# Patient Record
Sex: Male | Born: 1990 | Race: White | Hispanic: No | Marital: Married | Smoking: Former smoker
Health system: Southern US, Community
[De-identification: ages and names within clinical notes are randomized; demographics above are authoritative.]

## PROBLEM LIST (undated history)

## (undated) DIAGNOSIS — J45909 Unspecified asthma, uncomplicated: Secondary | ICD-10-CM

## (undated) DIAGNOSIS — F1990 Other psychoactive substance use, unspecified, uncomplicated: Secondary | ICD-10-CM

## (undated) DIAGNOSIS — I10 Essential (primary) hypertension: Secondary | ICD-10-CM

## (undated) DIAGNOSIS — F32A Depression, unspecified: Secondary | ICD-10-CM

## (undated) DIAGNOSIS — F419 Anxiety disorder, unspecified: Secondary | ICD-10-CM

## (undated) HISTORY — DX: Unspecified asthma, uncomplicated: J45.909

## (undated) HISTORY — DX: Other psychoactive substance use, unspecified, uncomplicated: F19.90

## (undated) HISTORY — DX: Depression, unspecified: F32.A

## (undated) HISTORY — DX: Essential (primary) hypertension: I10

## (undated) HISTORY — DX: Anxiety disorder, unspecified: F41.9

## (undated) HISTORY — PX: FRACTURE SURGERY: SHX138

---

## 2020-05-06 DIAGNOSIS — U071 COVID-19: Secondary | ICD-10-CM | POA: Insufficient documentation

## 2020-05-06 DIAGNOSIS — F1320 Sedative, hypnotic or anxiolytic dependence, uncomplicated: Secondary | ICD-10-CM | POA: Insufficient documentation

## 2020-06-04 ENCOUNTER — Encounter: Payer: Self-pay | Admitting: Family Medicine

## 2020-06-04 ENCOUNTER — Ambulatory Visit (INDEPENDENT_AMBULATORY_CARE_PROVIDER_SITE_OTHER): Payer: Self-pay | Admitting: Family Medicine

## 2020-06-04 VITALS — BP 142/86 | HR 85 | Temp 97.9°F | Ht 70.47 in | Wt 269.5 lb

## 2020-06-04 DIAGNOSIS — I10 Essential (primary) hypertension: Secondary | ICD-10-CM

## 2020-06-04 DIAGNOSIS — F321 Major depressive disorder, single episode, moderate: Secondary | ICD-10-CM

## 2020-06-04 DIAGNOSIS — F411 Generalized anxiety disorder: Secondary | ICD-10-CM

## 2020-06-04 DIAGNOSIS — F410 Panic disorder [episodic paroxysmal anxiety] without agoraphobia: Secondary | ICD-10-CM | POA: Insufficient documentation

## 2020-06-04 MED ORDER — LISINOPRIL 20 MG PO TABS
20.0000 mg | ORAL_TABLET | Freq: Every day | ORAL | 1 refills | Status: DC
Start: 2020-06-04 — End: 2021-01-06

## 2020-06-04 MED ORDER — QUETIAPINE FUMARATE 50 MG PO TABS: 25.0000 mg | ORAL_TABLET | Freq: Every day | ORAL | 3 refills | Status: DC

## 2020-06-04 MED ORDER — FLUOXETINE HCL 20 MG PO TABS: 20.0000 mg | ORAL_TABLET | Freq: Every day | ORAL | 3 refills | Status: DC

## 2020-06-04 MED ORDER — BUSPIRONE HCL 10 MG PO TABS: 10.0000 mg | ORAL_TABLET | Freq: Three times a day (TID) | ORAL | 3 refills | Status: DC

## 2020-06-04 NOTE — Assessment & Plan Note (Signed)
He has significant anxiety along with depression.  Discussed management options.  Discussed that I would not recommend restarting benzodiazepines at this time.  Will start fluoxetine 20mg  with buspar 10mg  TID and seroquel 25-50mg  qhs.  Referrals entered for psychiatry for continued medication management  and therapist.

## 2020-06-04 NOTE — Progress Notes (Signed)
Scott Holland - 29 y.o. male MRN [id number]  Date of birth: 1991-02-20  Subjective Chief Complaint  Patient presents with  . Establish Care    HPI Scott Holland is a 29 y.o. male here today for initial visit.  He has history of anxiety and depression which he would like to discuss today.  He also has history of HTN.   -Depression and anxiety: He states that he has had depression and anxiety for several years.  He reports trying multiple medications for treatment including lexapro, sertraline, buspar and benzodiazepines.  He reports that the only thing that has really worked for him was clonazepam and alprazolam.  He reports that lexapro and sertraline were ineffective although he admits he didn't try them for very long.  Buspar was somewhat effective.  He has had associated insomnia and seroquel has worked well for this previously.  He is interested in seeing a therapist and establishing with psychiatry.   -HTN:  Current management with  Lisinopril.  He did recently increased this from 10mg  to 20mg  due to elevated BP readings at home.  He denies chest pain, shortness of breath, headache or vision changes.     HE also had COVID last month.  Reports some continued shortness of breath with exertion.  This has improved with using flovent daily and albuterol as needed.    Depression screen PHQ 2/9 06/04/2020  Decreased Interest 2  Down, Depressed, Hopeless 2  PHQ - 2 Score 4  Altered sleeping 2  Tired, decreased energy 3  Change in appetite 1  Feeling bad or failure about yourself  2  Trouble concentrating 2  Moving slowly or fidgety/restless 2  Suicidal thoughts 0  PHQ-9 Score 16  Difficult doing work/chores Very difficult   GAD 7 : Generalized Anxiety Score 06/04/2020  Nervous, Anxious, on Edge 3  Control/stop worrying 3  Worry too much - different things 3  Trouble relaxing 3  Restless 3  Easily annoyed or irritable 3  Afraid - awful might happen 3  Total GAD 7 Score 21   Anxiety Difficulty Extremely difficult      ROS:  A comprehensive ROS was completed and negative except as noted per HPI  No Known Allergies  Past Medical History:  Diagnosis Date  . Anxiety   . Asthma   . Depression   . Drug use   . Hypertension     Past Surgical History:  Procedure Laterality Date  . FRACTURE SURGERY      Social History   Socioeconomic History  . Marital status: Single    Spouse name: Not on file  . Number of children: Not on file  . Years of education: Not on file  . Highest education level: Not on file  Occupational History  . Occupation: Server  Tobacco Use  . Smoking status: Former Smoker    Packs/day: 1.00    Years: 3.00    Pack years: 3.00    Types: Cigarettes    Quit date: 08/22/2017    Years since quitting: 2.7  . Smokeless tobacco: Never Used  Vaping Use  . Vaping Use: Every day  Substance and Sexual Activity  . Alcohol use: Yes    Alcohol/week: 1.0 - 2.0 standard drink    Types: 1 - 2 Standard drinks or equivalent per week  . Drug use: Yes    Frequency: 2.0 times per week    Types: Marijuana  . Sexual activity: Yes    Partners: Female  Birth control/protection: None  Other Topics Concern  . Not on file  Social History Narrative  . Not on file   Social Determinants of Health   Financial Resource Strain:   . Difficulty of Paying Living Expenses: Not on file  Food Insecurity:   . Worried About Programme researcher, broadcasting/film/video in the Last Year: Not on file  . Ran Out of Food in the Last Year: Not on file  Transportation Needs:   . Lack of Transportation (Medical): Not on file  . Lack of Transportation (Non-Medical): Not on file  Physical Activity:   . Days of Exercise per Week: Not on file  . Minutes of Exercise per Session: Not on file  Stress:   . Feeling of Stress : Not on file  Social Connections:   . Frequency of Communication with Friends and Family: Not on file  . Frequency of Social Gatherings with Friends and Family:  Not on file  . Attends Religious Services: Not on file  . Active Member of Clubs or Organizations: Not on file  . Attends Banker Meetings: Not on file  . Marital Status: Not on file    Family History  Problem Relation Age of Onset  . Hypertension Mother   . Anxiety disorder Mother   . Hypertension Maternal Grandmother   . Anxiety disorder Maternal Grandmother   . Hypertension Maternal Uncle   . Anxiety disorder Maternal Uncle     Health Maintenance  Topic Date Due  . Hepatitis C Screening  Never done  . HIV Screening  Never done  . TETANUS/TDAP  Never done  . COVID-19 Vaccine (1) 06/20/2020 (Originally 04/04/2003)  . INFLUENZA VACCINE  11/19/2020 (Originally 03/22/2020)     ----------------------------------------------------------------------------------------------------------------------------------------------------------------------------------------------------------------- Physical Exam BP (!) 142/86 (BP Location: Left Arm, Patient Position: Sitting, Cuff Size: Large)   Pulse 85   Temp 97.9 F (36.6 C) (Oral)   Ht 5' 10.47" (1.79 m)   Wt 269 lb 8 oz (122.2 kg)   BMI 38.15 kg/m   Physical Exam Constitutional:      Appearance: Normal appearance.  HENT:     Head: Normocephalic and atraumatic.  Eyes:     General: No scleral icterus. Cardiovascular:     Rate and Rhythm: Normal rate and regular rhythm.  Pulmonary:     Effort: Pulmonary effort is normal.     Breath sounds: Normal breath sounds.  Musculoskeletal:     Cervical back: Neck supple.  Skin:    General: Skin is warm and dry.  Neurological:     General: No focal deficit present.     Mental Status: He is alert.  Psychiatric:        Mood and Affect: Mood normal.        Behavior: Behavior normal.      ------------------------------------------------------------------------------------------------------------------------------------------------------------------------------------------------------------------- Assessment and Plan  GAD (generalized anxiety disorder) He has significant anxiety along with depression.  Discussed management options.  Discussed that I would not recommend restarting benzodiazepines at this time.  Will start fluoxetine 20mg  with buspar 10mg  TID and seroquel 25-50mg  qhs.  Referrals entered for psychiatry for continued medication management  and therapist.   Essential hypertension He will continue lisinopril at 20mg .  Updated rx sent in.     Meds ordered this encounter  Medications  . FLUoxetine (PROZAC) 20 MG tablet    Sig: Take 1 tablet (20 mg total) by mouth daily.    Dispense:  30 tablet    Refill:  3  . QUEtiapine (  SEROQUEL) 50 MG tablet    Sig: Take 0.5-1 tablets (25-50 mg total) by mouth at bedtime.    Dispense:  30 tablet    Refill:  3  . busPIRone (BUSPAR) 10 MG tablet    Sig: Take 1 tablet (10 mg total) by mouth 3 (three) times daily.    Dispense:  90 tablet    Refill:  3  . lisinopril (ZESTRIL) 20 MG tablet    Sig: Take 1 tablet (20 mg total) by mouth daily.    Dispense:  90 tablet    Refill:  1    Return in about 3 weeks (around 06/25/2020) for anxiety.    This visit occurred during the SARS-CoV-2 public health emergency.  Safety protocols were in place, including screening questions prior to the visit, additional usage of staff PPE, and extensive cleaning of exam room while observing appropriate contact time as indicated for disinfecting solutions.

## 2020-06-04 NOTE — Assessment & Plan Note (Signed)
He will continue lisinopril at 20mg .  Updated rx sent in.

## 2020-06-04 NOTE — Patient Instructions (Addendum)
Nice to meet you today! Let's start fluoxetine 20mg  daily, Seroquel 25-50mg  at bedtime, Buspar 10mg  three times per day.   I have entered referrals to psychiatry and therapist.  You should be contacted to arrange an appointment.  Keep lisinopril at 20mg  daily.  We'll plan to follow up in 2-3 weeks.

## 2020-06-22 ENCOUNTER — Ambulatory Visit: Payer: Self-pay | Admitting: Family Medicine

## 2020-06-23 ENCOUNTER — Encounter: Payer: Self-pay | Admitting: Family Medicine

## 2020-06-23 ENCOUNTER — Ambulatory Visit (INDEPENDENT_AMBULATORY_CARE_PROVIDER_SITE_OTHER): Payer: Self-pay | Admitting: Family Medicine

## 2020-06-23 DIAGNOSIS — K047 Periapical abscess without sinus: Secondary | ICD-10-CM

## 2020-06-23 DIAGNOSIS — F321 Major depressive disorder, single episode, moderate: Secondary | ICD-10-CM

## 2020-06-23 DIAGNOSIS — I10 Essential (primary) hypertension: Secondary | ICD-10-CM

## 2020-06-23 DIAGNOSIS — F411 Generalized anxiety disorder: Secondary | ICD-10-CM

## 2020-06-23 MED ORDER — FLUOXETINE HCL 20 MG PO TABS
40.0000 mg | ORAL_TABLET | Freq: Every day | ORAL | 3 refills | Status: DC
Start: 2020-06-23 — End: 2020-07-14

## 2020-06-23 MED ORDER — BUSPIRONE HCL 15 MG PO TABS
15.0000 mg | ORAL_TABLET | Freq: Three times a day (TID) | ORAL | 0 refills | Status: DC
Start: 2020-06-23 — End: 2020-07-10

## 2020-06-23 MED ORDER — QUETIAPINE FUMARATE 100 MG PO TABS
100.0000 mg | ORAL_TABLET | Freq: Every day | ORAL | 0 refills | Status: DC
Start: 2020-06-23 — End: 2020-07-10

## 2020-06-23 MED ORDER — BUSPIRONE HCL 15 MG PO TABS
15.0000 mg | ORAL_TABLET | Freq: Three times a day (TID) | ORAL | 0 refills | Status: DC
Start: 2020-06-23 — End: 2020-06-23

## 2020-06-23 MED ORDER — AMOXICILLIN 500 MG PO CAPS: 500.0000 mg | ORAL_CAPSULE | Freq: Three times a day (TID) | ORAL | 0 refills | Status: DC

## 2020-06-23 MED ORDER — QUETIAPINE FUMARATE 100 MG PO TABS
100.0000 mg | ORAL_TABLET | Freq: Every day | ORAL | 0 refills | Status: DC
Start: 2020-06-23 — End: 2020-06-23

## 2020-06-23 MED ORDER — FLUOXETINE HCL 20 MG PO TABS
40.0000 mg | ORAL_TABLET | Freq: Every day | ORAL | 3 refills | Status: DC
Start: 2020-06-23 — End: 2020-06-23

## 2020-06-23 NOTE — Assessment & Plan Note (Signed)
He continues to have anxiety will increase buspar to 15mg  tid and seroquel to 100mg  qhs to help with associated insomnia.

## 2020-06-23 NOTE — Patient Instructions (Addendum)
Please contact: [facility]: [phone] to set up psychiatry appt.  Increased fluoxetine to 40mg  daily, Buspar to 15mg  TID, and seroquel to 100mg  at bedtime.  Start amoxicillin for dental infection.  Follow up in 6-8 weeks.

## 2020-06-23 NOTE — Assessment & Plan Note (Signed)
Blood pressure is at goal at for age and co-morbidities.  I recommend continuation of lisinopril.  In addition they were instructed to follow a low sodium diet with regular exercise to help to maintain adequate control of blood pressure.   

## 2020-06-23 NOTE — Assessment & Plan Note (Signed)
Depression improved but not controlled.  Increase fluoxetine to 40mg  daily.  Given information to schedule appt with [facility] for psychiatry appointment.

## 2020-06-23 NOTE — Progress Notes (Signed)
Scott Holland - 29 y.o. male MRN [id number]  Date of birth: August 12, 1991  Subjective Chief Complaint  Patient presents with  . Anxiety    HPI Scott Holland is a 29 y.o. male here today for follow up of anxiety and insomnia.  He has history of benzodiazepine dependence.  We started fluoxetine as well as buspar and seroquel at his last visit.  He has noticed some improvement but feels like he needs to increase all medications.  He has been referred to psychiatry however they have not been able to get in touch with him.    Depression screen [facility] 2/9 06/23/2020 06/04/2020  Decreased Interest 1 2  Down, Depressed, Hopeless 1 2  PHQ - 2 Score 2 4  Altered sleeping 2 2  Tired, decreased energy 1 3  Change in appetite 1 1  Feeling bad or failure about yourself  1 2  Trouble concentrating 0 2  Moving slowly or fidgety/restless 1 2  Suicidal thoughts 0 0  PHQ-9 Score 8 16  Difficult doing work/chores Somewhat difficult Very difficult   GAD 7 : Generalized Anxiety Score 06/23/2020 06/04/2020  Nervous, Anxious, on Edge 1 3  Control/stop worrying 1 3  Worry too much - different things 2 3  Trouble relaxing 1 3  Restless 1 3  Easily annoyed or irritable 1 3  Afraid - awful might happen 1 3  Total GAD 7 Score 8 21  Anxiety Difficulty Somewhat difficult Extremely difficult   He also has complaint of dental infection.  He has poor dentition with several broken teeth.  He has had some pulled but still needs extensive dental work.  Has had cheek swelling but not currently.  No fever or chills.    ROS:  A comprehensive ROS was completed and negative except as noted per HPI  No Known Allergies  Past Medical History:  Diagnosis Date  . Anxiety   . Asthma   . Depression   . Drug use   . Hypertension     Past Surgical History:  Procedure Laterality Date  . FRACTURE SURGERY      Social History   Socioeconomic History  . Marital status: Single    Spouse name: Not on file  . Number of  children: Not on file  . Years of education: Not on file  . Highest education level: Not on file  Occupational History  . Occupation: Server  Tobacco Use  . Smoking status: Former Smoker    Packs/day: 1.00    Years: 3.00    Pack years: 3.00    Types: Cigarettes    Quit date: 08/22/2017    Years since quitting: 2.8  . Smokeless tobacco: Never Used  Vaping Use  . Vaping Use: Every day  Substance and Sexual Activity  . Alcohol use: Yes    Alcohol/week: 1.0 - 2.0 standard drink    Types: 1 - 2 Standard drinks or equivalent per week  . Drug use: Yes    Frequency: 2.0 times per week    Types: Marijuana  . Sexual activity: Yes    Partners: Female    Birth control/protection: None  Other Topics Concern  . Not on file  Social History Narrative  . Not on file   Social Determinants of Health   Financial Resource Strain:   . Difficulty of Paying Living Expenses: Not on file  Food Insecurity:   . Worried About Programme researcher, broadcasting/film/video in the Last Year: Not on file  . Ran  Out of Food in the Last Year: Not on file  Transportation Needs:   . Lack of Transportation (Medical): Not on file  . Lack of Transportation (Non-Medical): Not on file  Physical Activity:   . Days of Exercise per Week: Not on file  . Minutes of Exercise per Session: Not on file  Stress:   . Feeling of Stress : Not on file  Social Connections:   . Frequency of Communication with Friends and Family: Not on file  . Frequency of Social Gatherings with Friends and Family: Not on file  . Attends Religious Services: Not on file  . Active Member of Clubs or Organizations: Not on file  . Attends Banker Meetings: Not on file  . Marital Status: Not on file    Family History  Problem Relation Age of Onset  . Hypertension Mother   . Anxiety disorder Mother   . Hypertension Maternal Grandmother   . Anxiety disorder Maternal Grandmother   . Hypertension Maternal Uncle   . Anxiety disorder Maternal Uncle      Health Maintenance  Topic Date Due  . Hepatitis C Screening  Never done  . COVID-19 Vaccine (1) Never done  . HIV Screening  Never done  . TETANUS/TDAP  Never done  . INFLUENZA VACCINE  11/19/2020 (Originally 03/22/2020)     ----------------------------------------------------------------------------------------------------------------------------------------------------------------------------------------------------------------- Physical Exam BP 131/72 (BP Location: Left Arm, Patient Position: Sitting, Cuff Size: Large)   Pulse 85   Temp 97.7 F (36.5 C)   Wt 274 lb 9.6 oz (124.6 kg)   SpO2 98%   BMI 38.87 kg/m   Physical Exam Constitutional:      Appearance: Normal appearance.  HENT:     Mouth/Throat:     Comments: Poor dentition with multiple caries and broken teeth.  Gums swollen.  Musculoskeletal:     Cervical back: Neck supple.  Lymphadenopathy:     Cervical: No cervical adenopathy.  Neurological:     Mental Status: He is alert.     ------------------------------------------------------------------------------------------------------------------------------------------------------------------------------------------------------------------- Assessment and Plan  GAD (generalized anxiety disorder) He continues to have anxiety will increase buspar to 15mg  tid and seroquel to 100mg  qhs to help with associated insomnia.    Depression, major, single episode, moderate (HCC) Depression improved but not controlled.  Increase fluoxetine to 40mg  daily.  Given information to schedule appt with [facility] for psychiatry appointment.   Dental infection Will treat with amoxicillin 500mg  tid.  Encouraged to follow up with dentist.   Essential hypertension Blood pressure is at goal at for age and co-morbidities.  I recommend continuation of lisinopril.  In addition they were instructed to follow a low sodium diet with regular exercise to help to maintain adequate control  of blood pressure.     Meds ordered this encounter  Medications  . DISCONTD: busPIRone (BUSPAR) 15 MG tablet    Sig: Take 1 tablet (15 mg total) by mouth 3 (three) times daily.    Dispense:  90 tablet    Refill:  0  . DISCONTD: FLUoxetine (PROZAC) 20 MG tablet    Sig: Take 2 tablets (40 mg total) by mouth daily.    Dispense:  60 tablet    Refill:  3  . DISCONTD: QUEtiapine (SEROQUEL) 100 MG tablet    Sig: Take 1 tablet (100 mg total) by mouth at bedtime.    Dispense:  90 tablet    Refill:  0  . amoxicillin (AMOXIL) 500 MG capsule    Sig: Take 1 capsule (  500 mg total) by mouth 3 (three) times daily.    Dispense:  30 capsule    Refill:  0  . busPIRone (BUSPAR) 15 MG tablet    Sig: Take 1 tablet (15 mg total) by mouth 3 (three) times daily.    Dispense:  90 tablet    Refill:  0  . FLUoxetine (PROZAC) 20 MG tablet    Sig: Take 2 tablets (40 mg total) by mouth daily.    Dispense:  60 tablet    Refill:  3  . QUEtiapine (SEROQUEL) 100 MG tablet    Sig: Take 1 tablet (100 mg total) by mouth at bedtime.    Dispense:  90 tablet    Refill:  0    Return in about 8 weeks (around 08/18/2020) for anxiety.    This visit occurred during the SARS-CoV-2 public health emergency.  Safety protocols were in place, including screening questions prior to the visit, additional usage of staff PPE, and extensive cleaning of exam room while observing appropriate contact time as indicated for disinfecting solutions.

## 2020-06-23 NOTE — Assessment & Plan Note (Signed)
Will treat with amoxicillin 500mg  tid.  Encouraged to follow up with dentist.

## 2020-07-10 ENCOUNTER — Encounter: Payer: Self-pay | Admitting: Family Medicine

## 2020-07-10 ENCOUNTER — Other Ambulatory Visit: Payer: Self-pay | Admitting: Family Medicine

## 2020-07-14 ENCOUNTER — Other Ambulatory Visit: Payer: Self-pay | Admitting: Family Medicine

## 2020-07-14 MED ORDER — QUETIAPINE FUMARATE 100 MG PO TABS
100.0000 mg | ORAL_TABLET | Freq: Every day | ORAL | 0 refills | Status: DC
Start: 2020-07-14 — End: 2020-10-16

## 2020-07-14 MED ORDER — BUSPIRONE HCL 15 MG PO TABS
15.0000 mg | ORAL_TABLET | Freq: Three times a day (TID) | ORAL | 0 refills | Status: DC
Start: 2020-07-14 — End: 2020-08-19

## 2020-07-14 MED ORDER — ESCITALOPRAM OXALATE 10 MG PO TABS: ORAL_TABLET | ORAL | 1 refills | Status: DC

## 2020-07-22 ENCOUNTER — Ambulatory Visit: Payer: Self-pay | Admitting: Allergy

## 2020-07-28 ENCOUNTER — Encounter: Payer: Self-pay | Admitting: Nurse Practitioner

## 2020-07-28 ENCOUNTER — Ambulatory Visit (INDEPENDENT_AMBULATORY_CARE_PROVIDER_SITE_OTHER): Payer: Self-pay

## 2020-07-28 ENCOUNTER — Other Ambulatory Visit: Payer: Self-pay

## 2020-07-28 ENCOUNTER — Telehealth (INDEPENDENT_AMBULATORY_CARE_PROVIDER_SITE_OTHER): Payer: Self-pay | Admitting: Nurse Practitioner

## 2020-07-28 DIAGNOSIS — R0602 Shortness of breath: Secondary | ICD-10-CM

## 2020-07-28 DIAGNOSIS — R0609 Other forms of dyspnea: Secondary | ICD-10-CM

## 2020-07-28 DIAGNOSIS — R0600 Dyspnea, unspecified: Secondary | ICD-10-CM

## 2020-07-28 DIAGNOSIS — R059 Cough, unspecified: Secondary | ICD-10-CM

## 2020-07-28 DIAGNOSIS — J0110 Acute frontal sinusitis, unspecified: Secondary | ICD-10-CM

## 2020-07-28 DIAGNOSIS — R5081 Fever presenting with conditions classified elsewhere: Secondary | ICD-10-CM

## 2020-07-28 DIAGNOSIS — U099 Post covid-19 condition, unspecified: Secondary | ICD-10-CM

## 2020-07-28 MED ORDER — AMOXICILLIN-POT CLAVULANATE 875-125 MG PO TABS: 1.0000 | ORAL_TABLET | Freq: Two times a day (BID) | ORAL | 0 refills | Status: DC

## 2020-07-28 MED ORDER — PREDNISONE 50 MG PO TABS: 50.0000 mg | ORAL_TABLET | Freq: Every day | ORAL | 0 refills | Status: AC

## 2020-07-28 MED ORDER — MONTELUKAST SODIUM 10 MG PO TABS: 10.0000 mg | ORAL_TABLET | Freq: Every day | ORAL | 3 refills | Status: DC

## 2020-07-28 MED ORDER — FLUTICASONE-SALMETEROL 250-50 MCG/DOSE IN AEPB: 1.0000 | INHALATION_SPRAY | Freq: Two times a day (BID) | RESPIRATORY_TRACT | 3 refills | Status: DC

## 2020-07-28 NOTE — Progress Notes (Addendum)
Virtual Video Visit via MyChart Note  I connected with  Eithen Ogan on 07/28/20 at 10:50 AM EST by the video enabled telemedicine application for , MyChart, and verified that I am speaking with the correct person using two identifiers.   I introduced myself as a Publishing rights manager with the practice. We discussed the limitations of evaluation and management by telemedicine and the availability of in person appointments. The patient expressed understanding and agreed to proceed.  Participating parties in this visit include: The patient and the nurse practitioner listed only The patient is: at home I am: In the office  Subjective:    CC:  Chief Complaint  Patient presents with  . URI    onset 2 weeks ago. wheezing, ShOB, chills, sweats, BA, fatigue, feverish, congestion, has been taking Mucinex, Dayquil, completed atbx 5 days ago, completed prednisone 2 days ago from girlfriends old Rx    HPI: Tashi Band is a 29 y.o. y/o male presenting via MyChart today for flu-like symptoms that started about 2 weeks ago. He reports that his daughter was sick initially and then he became ill. His daughter has been diagnosed with the flu as has his girlfriend.   He endorses shortness of breath, cough, wheezing, chills, fever, congestion, body aches, sinus pain and pressure, headaches, bilateral ear pain, nausea, vomiting, diarrhea, and fatigue.  He states that he cannot talk without going into coughing fits. He is not sleeping well. He also reports that he has been dealing with severe anxiety disorder and feels this exacerbates his symptoms.   He tells me that he had COVID in September and has not returned to his baseline with breathing.   He denies loss of taste and loss of smell.   He has been taking Mucinex, Dayquil, Theraflu, Flonase with minimal relief. He uses Flovent 110 and albuterol regularly and feels that they are only helping with the symptoms for the first few minutes after use.   He  completed a round of amoxicillin approximately 5 days ago and reports that he also took a prescription for 5 day prednisone taper that his girlfriend had from previous illness and completed this 2 days ago.  Past medical history, Surgical history, Family history not pertinant except as noted below, Social history, Allergies, and medications have been entered into the medical record, reviewed, and corrections made.   Review of Systems:  All review of systems negative except what is listed in the HPI   Objective:    General: Speaking clearly in complete sentences. Audible congestion and cough present. Mild shortness of breath when speaking noted.    Alert and oriented x3.   Normal judgment.  No apparent acute distress.   Impression and Recommendations:    1. Acute non-recurrent frontal sinusitis 2. Cough 3. Dyspnea on exertion 4. Fever in other diseases Symptoms and presentation consistent with acute sinusitis of suspected bacterial origin given the length of time symptoms have been present. Also strongly suspect the presence of flu A today given that both his girlfriend and daughter have been diagnosed with the same.  Will plan for chest x-ray today given ongoing shortness of breath and respiratory symptoms since COVID in September to rule out possible pneumonia.  Change fluticasone inhaler to fluticasone-salmeterol and increase dose for BID use.  Prednisone burst for 5 days given for additional symptoms of shortness of breath and wheezing.  Patient may benefit from pulmonology referral based on continued respiratory symptoms after COVID-19. Will make final determination after CXR evaluation.  If pna is present, patient may need additional antibiotic coverage.  Discussed OTC medications that may be helpful for symptom management. DIscussed emergency symptoms that would warrant immediate evaluation in the ED.  Follow-up if symptoms worsen or fail to improve.  Work note provided VIA  mychart to remain out of work through Saturday 12/11 - DG Chest 2 View - montelukast (SINGULAIR) 10 MG tablet; Take 1 tablet (10 mg total) by mouth at bedtime.  Dispense: 30 tablet; Refill: 3 - predniSONE (DELTASONE) 50 MG tablet; Take 1 tablet (50 mg total) by mouth daily with breakfast for 5 days.  Dispense: 5 tablet; Refill: 0 - amoxicillin-clavulanate (AUGMENTIN) 875-125 MG tablet; Take 1 tablet by mouth 2 (two) times daily.  Dispense: 14 tablet; Refill: 0 - Fluticasone-Salmeterol (ADVAIR DISKUS) 250-50 MCG/DOSE AEPB; Inhale 1 puff into the lungs 2 (two) times daily.  Dispense: 60 each; Refill: 3  Addendum: 07/29/2020 1542 Chest x-ray results show no signs of cardiopulmonary abnormality.   Singulair patient cost over $100. Due to expense will change back to fluticasone and add LABA separately to see if this is more affordable option.     I discussed the assessment and treatment plan with the patient. The patient was provided an opportunity to ask questions and all were answered. The patient agreed with the plan and demonstrated an understanding of the instructions.   The patient was advised to call back or seek an in-person evaluation if the symptoms worsen or if the condition fails to improve as anticipated.  I provided 21 minutes of non-face-to-face interaction with this MYCHART visit including intake, same-day documentation, and chart review.   Tollie Eth, NP

## 2020-07-28 NOTE — Patient Instructions (Signed)
*   Continue to use your albuterol inhaler as prescribed for fast relief of symptoms.  * I have sent an inhaler called Advair (fluticasone-salmeterol) to the pharmacy for you. Stop using the flovent (fluticasone) inhaler you have and start this one- it has a stronger dose of the same medication plus another long acting medication. Be sure to rinse your mouth after using.  * You can continue to use Mucinex, Flonase and other over the counter flu medications. Be sure you are not double dosing on the same medication by reading the packaging carefully.  * Allow yourself to rest as much as possible. I have provided a work note through Saturday.  * I have sent a 5 day burst of Prednisone 50mg . You will take one of these every morning for 5 days.  * I have sent 7 days of Augmentin. You will take this twice a day (take with food) for 7 days. Please finish even if your symptoms improve to prevent antibiotic resistance.  * If your chest x-ray shows pneumonia, I will send in another antibiotic for you to be used with the Augmentin.  * Humidified air can help some of the symptoms you are experiencing. You can use a humidifier or boil water on the stove and stand near the steam (be careful not to get burned by the steam).  The cough and shortness of breath may take several more weeks to fully improve. Unfortunately we are seeing this with COVID. We can do a pulmonology referral and consider pulmonary rehab if this continues despite improvement of other symptoms.   Please let know if you symptoms worsen or fail to improve.

## 2020-07-29 ENCOUNTER — Telehealth: Payer: Self-pay

## 2020-07-29 MED ORDER — FLUTICASONE PROPIONATE HFA 110 MCG/ACT IN AERO: 2.0000 | INHALATION_SPRAY | Freq: Two times a day (BID) | RESPIRATORY_TRACT | 2 refills | Status: DC

## 2020-07-29 MED ORDER — SALMETEROL XINAFOATE 50 MCG/DOSE IN AEPB: 1.0000 | INHALATION_SPRAY | Freq: Two times a day (BID) | RESPIRATORY_TRACT | 2 refills | Status: DC

## 2020-07-29 NOTE — Telephone Encounter (Signed)
Pt's girlfriend [place] (on [place] form) called to check on the status of the CXR results. She also said that they went to the pharmacy and that the Advair will cost over  $100.00. She said this is the price with using [facility]. She is wanting to know if there is anything cheaper he could be switched to.

## 2020-07-29 NOTE — Addendum Note (Signed)
Addended by: Tamie Minteer, [place] Dec E on: 07/29/2020 03:47 PM   Modules accepted: Orders

## 2020-07-29 NOTE — Telephone Encounter (Signed)
Chest x-ray result note sent- it didn't result on my end until after 3pm today. Unfortunately, I think the dual therapy inhalers will all be about the same, but I can send for the increased fluticasone only and see if that helps with is symptoms and is more affordable.

## 2020-07-29 NOTE — Progress Notes (Signed)
Chest x-ray results have come in and show no signs of pneumonia or other abnormality, which is great. We do see ongoing cough and respiratory symptoms with patients who have had COVID and these can often last for several weeks to even months.   If your symptoms are still present when you have completed the antibiotic and steroid, we may consider a referral to pulmonology for further evaluation and recommendations on management.   I am hopeful restarting the Singulair will help with symptoms, as well.

## 2020-07-30 NOTE — Telephone Encounter (Signed)
Pt and his girlfriend aware of results via MyChart.

## 2020-08-04 ENCOUNTER — Other Ambulatory Visit: Payer: Self-pay | Admitting: Family Medicine

## 2020-08-04 MED ORDER — BUPROPION HCL ER (XL) 150 MG PO TB24: 150.0000 mg | ORAL_TABLET | Freq: Every day | ORAL | 0 refills | Status: DC

## 2020-08-05 ENCOUNTER — Other Ambulatory Visit: Payer: Self-pay

## 2020-08-05 DIAGNOSIS — R0609 Other forms of dyspnea: Secondary | ICD-10-CM

## 2020-08-05 DIAGNOSIS — R059 Cough, unspecified: Secondary | ICD-10-CM

## 2020-08-05 MED ORDER — SALMETEROL XINAFOATE 50 MCG/DOSE IN AEPB: 1.0000 | INHALATION_SPRAY | Freq: Two times a day (BID) | RESPIRATORY_TRACT | 2 refills | Status: DC

## 2020-08-05 MED ORDER — BUPROPION HCL ER (XL) 150 MG PO TB24
150.0000 mg | ORAL_TABLET | Freq: Every day | ORAL | 0 refills | Status: DC
Start: 2020-08-05 — End: 2020-12-02

## 2020-08-14 ENCOUNTER — Other Ambulatory Visit: Payer: Self-pay

## 2020-08-14 ENCOUNTER — Emergency Department (HOSPITAL_COMMUNITY): Payer: Self-pay

## 2020-08-14 ENCOUNTER — Encounter (HOSPITAL_COMMUNITY): Payer: Self-pay | Admitting: Emergency Medicine

## 2020-08-14 ENCOUNTER — Telehealth: Payer: Medicaid Other | Admitting: Physician Assistant

## 2020-08-14 ENCOUNTER — Emergency Department (HOSPITAL_COMMUNITY)
Admission: EM | Admit: 2020-08-14 | Discharge: 2020-08-14 | Disposition: A | Discharge status: Home / Self Care | Payer: Self-pay | Attending: Emergency Medicine | Admitting: Emergency Medicine

## 2020-08-14 DIAGNOSIS — J45909 Unspecified asthma, uncomplicated: Secondary | ICD-10-CM | POA: Insufficient documentation

## 2020-08-14 DIAGNOSIS — Z8616 Personal history of COVID-19: Secondary | ICD-10-CM | POA: Insufficient documentation

## 2020-08-14 DIAGNOSIS — K029 Dental caries, unspecified: Secondary | ICD-10-CM | POA: Insufficient documentation

## 2020-08-14 DIAGNOSIS — K0889 Other specified disorders of teeth and supporting structures: Secondary | ICD-10-CM

## 2020-08-14 DIAGNOSIS — Z87891 Personal history of nicotine dependence: Secondary | ICD-10-CM | POA: Insufficient documentation

## 2020-08-14 DIAGNOSIS — K047 Periapical abscess without sinus: Secondary | ICD-10-CM

## 2020-08-14 DIAGNOSIS — I10 Essential (primary) hypertension: Secondary | ICD-10-CM | POA: Insufficient documentation

## 2020-08-14 DIAGNOSIS — Z79899 Other long term (current) drug therapy: Secondary | ICD-10-CM | POA: Insufficient documentation

## 2020-08-14 LAB — CBC WITH DIFFERENTIAL/PLATELET
Abs Immature Granulocytes: 0.02 10*3/uL (ref 0.00–0.07)
Basophils Absolute: 0.1 10*3/uL (ref 0.0–0.1)
Basophils Relative: 1 %
Eosinophils Absolute: 0.4 10*3/uL (ref 0.0–0.5)
Eosinophils Relative: 4 %
HCT: 39.8 % (ref 39.0–52.0)
Hemoglobin: 12.9 g/dL — ABNORMAL LOW (ref 13.0–17.0)
Immature Granulocytes: 0 %
Lymphocytes Relative: 22 %
Lymphs Abs: 1.8 10*3/uL (ref 0.7–4.0)
MCH: 31.2 pg (ref 26.0–34.0)
MCHC: 32.4 g/dL (ref 30.0–36.0)
MCV: 96.4 fL (ref 80.0–100.0)
Monocytes Absolute: 0.8 10*3/uL (ref 0.1–1.0)
Monocytes Relative: 10 %
Neutro Abs: 5.2 10*3/uL (ref 1.7–7.7)
Neutrophils Relative %: 63 %
Platelets: 281 10*3/uL (ref 150–400)
RBC: 4.13 MIL/uL — ABNORMAL LOW (ref 4.22–5.81)
RDW: 12.8 % (ref 11.5–15.5)
WBC: 8.3 10*3/uL (ref 4.0–10.5)
nRBC: 0 % (ref 0.0–0.2)

## 2020-08-14 LAB — COMPREHENSIVE METABOLIC PANEL
ALT: 26 U/L (ref 0–44)
AST: 20 U/L (ref 15–41)
Albumin: 3.6 g/dL (ref 3.5–5.0)
Alkaline Phosphatase: 34 U/L — ABNORMAL LOW (ref 38–126)
Anion gap: 8 (ref 5–15)
BUN: 11 mg/dL (ref 6–20)
CO2: 28 mmol/L (ref 22–32)
Calcium: 9 mg/dL (ref 8.9–10.3)
Chloride: 103 mmol/L (ref 98–111)
Creatinine, Ser: 0.91 mg/dL (ref 0.61–1.24)
GFR, Estimated: >60 mL/min (ref >60)
Glucose, Bld: 93 mg/dL (ref 70–99)
Potassium: 4.7 mmol/L (ref 3.5–5.1)
Sodium: 139 mmol/L (ref 135–145)
Total Bilirubin: 0.5 mg/dL (ref 0.3–1.2)
Total Protein: 6.1 g/dL — ABNORMAL LOW (ref 6.5–8.1)

## 2020-08-14 MED ORDER — HYDROCODONE-ACETAMINOPHEN 5-325 MG PO TABS
1.0000 | ORAL_TABLET | Freq: Once | ORAL | Status: AC
  Administered 2020-08-14: 13:00:00 1 via ORAL
  Filled 2020-08-14: qty 1

## 2020-08-14 MED ORDER — BUSPIRONE HCL 10 MG PO TABS
15.0000 mg | ORAL_TABLET | Freq: Three times a day (TID) | ORAL | Status: DC
  Administered 2020-08-14: 16:00:00 15 mg via ORAL
  Filled 2020-08-14: qty 2

## 2020-08-14 MED ORDER — KETOROLAC TROMETHAMINE 30 MG/ML IJ SOLN
30.0000 mg | Freq: Once | INTRAMUSCULAR | Status: AC
  Administered 2020-08-14: 16:00:00 30 mg via INTRAVENOUS
  Filled 2020-08-14: qty 1

## 2020-08-14 MED ORDER — BUPROPION HCL ER (XL) 150 MG PO TB24
150.0000 mg | ORAL_TABLET | Freq: Every day | ORAL | Status: DC
  Administered 2020-08-14: 16:00:00 150 mg via ORAL
  Filled 2020-08-14: qty 1

## 2020-08-14 MED ORDER — IOHEXOL 350 MG/ML SOLN
80.0000 mL | Freq: Once | INTRAVENOUS | Status: AC | PRN
  Administered 2020-08-14: 80 mL via INTRAVENOUS

## 2020-08-14 NOTE — Discharge Instructions (Addendum)
Your laboratory results were within normal limits today.  The CT scan of your face and mouth did show many dental cavities however no evidence of inflammation/infection or pocket of infection that would need to be drained.  You have a very small amount of fluid in your sinuses that are on either side of your nose under your eyes however this is minimal.    Please try saline nasal sprays, in addition to taking an allergy medicine such as Allegra, Claritin, or Zyrtec to help this fluid drained.  If you develop fevers or have additional concerns please seek additional medical care.  Please take Ibuprofen (Advil, motrin) and Tylenol (acetaminophen) to relieve your pain.  You may take up to 600 MG (3 pills) of normal strength ibuprofen every 8 hours as needed.  In between doses of ibuprofen you make take tylenol, up to 1,000 mg (two extra strength pills).  Do not take more than 3,000 mg tylenol in a 24 hour period.  Please check all medication labels as many medications such as pain and cold medications may contain tylenol.  Do not drink alcohol while taking these medications.  Do not take other NSAID'S while taking ibuprofen (such as aleve or naproxen).  Please take ibuprofen with food to decrease stomach upset.  In the emergency room you are given Toradol through your IV.  Please wait at least 6 hours after this before you take any ibuprofen, Aleve, naproxen or Naprosyn as these are in the same drug class.  Today you received medications that may make you sleepy or impair your ability to make decisions.  For the next 12 hours please do not drive, operate heavy machinery, care for a small child with out another adult present, or perform any activities that may cause harm to you or someone else if you were to fall asleep or be impaired.

## 2020-08-14 NOTE — ED Provider Notes (Signed)
[facility]   CSN: [id number] Arrival date & time: 08/14/20  1220     History Chief Complaint  Patient presents with  . Dental Pain    Billy FischerDylan Pichardo is a 29 y.o. male.  29 y.o male with a PMH of Anxiety, presents to the ED with a chief complaint of dental pain x 3 weeks.  Patient was evaluated via telehealth, advised to be seen in the ED for further evaluation.  The history is provided by the patient.  Dental Pain Location:  Upper Quality:  Aching and constant Severity:  Moderate Onset quality:  Gradual Duration:  3 weeks Timing:  Constant Progression:  Worsening Chronicity:  Recurrent Context: abscess, dental caries and poor dentition   Context: not recent dental surgery and not trauma   Relieved by:  Nothing Worsened by:  Cold food/drink Ineffective treatments:  Acetaminophen and NSAIDs Associated symptoms: facial pain and headaches   Associated symptoms: no difficulty swallowing, no fever, no neck swelling, no oral bleeding and no oral lesions   Risk factors: lack of dental care   Risk factors: no cancer, no diabetes, no immunosuppression and no periodontal disease        Past Medical History:  Diagnosis Date  . Anxiety   . Asthma   . Depression   . Drug use   . Hypertension     Patient Active Problem List   Diagnosis Date Noted  . Dental infection 06/23/2020  . Depression, major, single episode, moderate (HCC) 06/04/2020  . GAD (generalized anxiety disorder) 06/04/2020  . Essential hypertension 06/04/2020  . Severe benzodiazepine use disorder (HCC) 05/06/2020  . COVID-19 virus detected 05/06/2020    Past Surgical History:  Procedure Laterality Date  . FRACTURE SURGERY         Family History  Problem Relation Age of Onset  . Hypertension Mother   . Anxiety disorder Mother   . Hypertension Maternal Grandmother   . Anxiety disorder Maternal Grandmother   . Hypertension Maternal Uncle   .  Anxiety disorder Maternal Uncle     Social History   Tobacco Use  . Smoking status: Former Smoker    Packs/day: 1.00    Years: 3.00    Pack years: 3.00    Types: Cigarettes    Quit date: 08/22/2017    Years since quitting: 2.9  . Smokeless tobacco: Never Used  Vaping Use  . Vaping Use: Every day  Substance Use Topics  . Alcohol use: Yes    Alcohol/week: 1.0 - 2.0 standard drink    Types: 1 - 2 Standard drinks or equivalent per week  . Drug use: Yes    Frequency: 2.0 times per week    Types: Marijuana    Home Medications Prior to Admission medications   Medication Sig Start Date End Date Taking? Authorizing Provider  acetaminophen (TYLENOL) 500 MG tablet Take 1,000 mg by mouth every 8 (eight) hours as needed (pain).   Yes [provider]  albuterol (VENTOLIN HFA) 108 (90 Base) MCG/ACT inhaler Inhale 2 puffs into the lungs every 6 (six) hours as needed for wheezing or shortness of breath.   Yes [provider]  Ascorbic Acid (VITAMIN C PO) Take 1 tablet by mouth daily.   Yes [provider]  buPROPion (WELLBUTRIN XL) 150 MG 24 hr tablet Take 1 tablet (150 mg total) by mouth daily. 08/05/20  Yes Everrett CoombeMatthews, Cody, DO  busPIRone (BUSPAR) 15 MG tablet Take 1 tablet (15 mg  total) by mouth 3 (three) times daily. 07/14/20  Yes Sunnie Nielsen, DO  escitalopram (LEXAPRO) 10 MG tablet Start 5mg  daily x1 week then increase to 10mg  daily Patient taking differently: Take 10 mg by mouth at bedtime. 07/14/20  Yes , DO  fluticasone (FLOVENT HFA) 110 MCG/ACT inhaler Inhale 2 puffs into the lungs in the morning and at bedtime. Use immediately after salmeterol (Serevent) inhaler. Rinse mouth after each use. Patient taking differently: Inhale 2 puffs into the lungs in the morning and at bedtime. Rinse mouth after each use. 07/29/20  Yes Early, Everrett Coombe, NP  ibuprofen (ADVIL) 200 MG tablet Take 800 mg by mouth every 8 (eight) hours as needed (pain).   Yes [provider]  lisinopril (ZESTRIL) 20 MG tablet Take 1 tablet (20 mg total) by mouth daily. 06/04/20  Yes Sung Amabile, DO  montelukast (SINGULAIR) 10 MG tablet Take 1 tablet (10 mg total) by mouth at bedtime. 07/28/20  Yes Early, Everrett Coombe, NP  Multiple Vitamin (MULTIVITAMIN WITH MINERALS) TABS tablet Take 1 tablet by mouth daily.   Yes [provider]  QUEtiapine (SEROQUEL) 100 MG tablet Take 1 tablet (100 mg total) by mouth at bedtime. 07/14/20  Yes Sung Amabile, DO  salmeterol (SEREVENT) 50 MCG/DOSE diskus inhaler Inhale 1 puff into the lungs 2 (two) times daily. Use immediately before fluticasone (Flovent) inhaler. Patient not taking: No sig reported 08/05/20   Sunnie Nielsen, DO    Allergies    Patient has no known allergies.  Review of Systems   Review of Systems  Constitutional: Negative for fever.  HENT: Positive for dental problem. Negative for mouth sores.   Neurological: Positive for headaches.    Physical Exam Updated Vital Signs BP (!) 159/85 (BP Location: Right Arm)   Pulse 94   Temp 97.9 F (36.6 C) (Oral)   Resp 18   SpO2 99%   Physical Exam Vitals and nursing note reviewed.  Constitutional:      Appearance: He is well-developed and well-nourished.  HENT:     Head: Normocephalic and atraumatic.     Nose:     Right Sinus: Maxillary sinus tenderness present.     Left Sinus: Maxillary sinus tenderness present.     Mouth/Throat:     Mouth: Oropharynx is clear and moist. Mucous membranes are moist.     Dentition: Dental tenderness and dental caries present. No gingival swelling or dental abscesses.     Pharynx: Uvula midline. No pharyngeal swelling.     Tonsils: No tonsillar exudate or tonsillar abscesses.      Comments: Poor dentition throughout, no visible dental abscess on my exam.  Eyes:     General: No scleral icterus.    Pupils: Pupils are equal, round, and reactive to light.  Cardiovascular:     Heart sounds: Normal heart sounds.   Pulmonary:     Effort: Pulmonary effort is normal.     Breath sounds: Normal breath sounds. No wheezing.  Chest:     Chest wall: No tenderness.  Abdominal:     General: Bowel sounds are normal. There is no distension.     Palpations: Abdomen is soft.     Tenderness: There is no abdominal tenderness.  Musculoskeletal:        General: No tenderness or deformity.     Cervical back: Normal range of motion.  Skin:    General: Skin is warm and dry.  Neurological:     Mental Status: He is alert  and oriented to person, place, and time.     ED Results / Procedures / Treatments   Labs (all labs ordered are listed, but only abnormal results are displayed) Labs Reviewed  CBC WITH DIFFERENTIAL/PLATELET - Abnormal; Notable for the following components:      Result Value   RBC 4.13 (*)    Hemoglobin 12.9 (*)    All other components within normal limits  COMPREHENSIVE METABOLIC PANEL - Abnormal; Notable for the following components:   Total Protein 6.1 (*)    Alkaline Phosphatase 34 (*)    All other components within normal limits    EKG None  Radiology No results found.  Procedures Procedures (including critical care time)  Medications Ordered in ED Medications  busPIRone (BUSPAR) tablet 15 mg (has no administration in time range)  buPROPion (WELLBUTRIN XL) 24 hr tablet 150 mg (has no administration in time range)  HYDROcodone-acetaminophen (NORCO/VICODIN) 5-325 MG per tablet 1 tablet (1 tablet Oral Given 08/14/20 1309)    ED Course  I have reviewed the triage vital signs and the nursing notes.  Pertinent labs & imaging results that were available during my care of the patient were reviewed by me and considered in my medical decision making (see chart for details).    MDM Rules/Calculators/A&P       Patient with a past medical history of anxiety presents to the ED with chief complaint of dental abscess and pain for the past 3 weeks.  Evaluated via telehealth, advised to  be seen in the ED for further evaluation.  Patient reports pain began on the right side of his mouth, has now moved onto the left, now it involves his sinuses along with head.  He reports after the last 5 days he has had more severe pain along with constant headaches.  He has tried tramadol, completed a course of amoxicillin, and is currently on Bactrim to treat a dental infection.  He does have a dental appointment scheduled for Monday morning.  During evaluation no visible abscess is seen, there is poor dentition throughout, multiple teeth are broken, multiple keratoses are noted.  There is pain along the maxillary sinus.  Uvula is midline, no visible abscess, no tonsillar exudates, no PTA noted.  Phonation is intact.  He does report pain is exacerbated with eating and drinking, soda noted at patient's bedside.  2:42 PM patient is requesting more pain medication, he was given a Vicodin while in the ED. States that his pain is now worse than when he came in. Discussed with patient there were currently awaiting CT imaging along with labs. Patient reports he did not take any of his anxiety medication is requesting these at this time, I advised him the pharmacy technician will update his medication and I will be happy to provide his anxiety home meds.   Interpretation of his labs reveal a CMP without any electrolyte abnormality, kidney function is unremarkable. LFTs are within normal limits. CBC without any signs of leukocytosis, hemoglobin slightly decreased but stable and no priors or comparisons.Patient is awaiting CT maxillofacial.   3:06 PM patient's home meds have been ordered, CT maxillofacial is currently pending.   Patient care signed out to incoming provider pending CT.     Portions of this note were generated with Scientist, clinical (histocompatibility and immunogenetics). Dictation errors may occur despite best attempts at proofreading.  Final Clinical Impression(s) / ED Diagnoses Final diagnoses:  Pain, dental    Rx  / DC Orders ED Discharge Orders  None       Claude Manges, PA-C 08/14/20 1521    Linwood Dibbles, MD 08/15/20 [phone]

## 2020-08-14 NOTE — ED Notes (Signed)
Pt stated that his pain is now worse than when he first came in. RN notified.

## 2020-08-14 NOTE — ED Triage Notes (Signed)
Pt arrives to ED with c/o of right lower dental pain that radiates to the left side of his mouth x4 days. Pt states the pain has not been resolved with OTC meds. Has headache.

## 2020-08-14 NOTE — ED Provider Notes (Signed)
I assumed care of patient at shift change from previous team, briefly patient is a 29 year old male with a past medical history of anxiety who presents with dental pain.  He had reported inability to swallow however was noted to have a drink at bedside.   Physical Exam  BP (!) 159/85 (BP Location: Right Arm)   Pulse 94   Temp 97.9 F (36.6 C) (Oral)   Resp 18   SpO2 99%   Physical Exam Patient initially resting comfortably in bed.  When I walk over to him he starts fidgeting constantly moving his arms and legs.  His voice is not muffled.   ED Course/Procedures     Procedures  DG Chest 2 View  Result Date: 07/29/2020 CLINICAL DATA:  Shortness of breath cough with COVID in September. Cough ever since EXAM: CHEST - 2 VIEW COMPARISON:  May 06, 2020 FINDINGS: Trachea midline. Cardiomediastinal contours and hilar structures are normal. Lungs are clear.  No sign of pleural effusion. On limited assessment no acute skeletal process. Signs of spinal degenerative changes. IMPRESSION: No acute cardiopulmonary disease. Electronically Signed   By: Donzetta Kohut M.D.   On: 07/29/2020 14:15   CT Maxillofacial W Contrast  Result Date: 08/14/2020 CLINICAL DATA:  Dental pain for 3 weeks. EXAM: CT MAXILLOFACIAL WITH CONTRAST TECHNIQUE: Multidetector CT imaging of the maxillofacial structures was performed with intravenous contrast. Multiplanar CT image reconstructions were also generated. CONTRAST:  63mL OMNIPAQUE IOHEXOL 350 MG/ML SOLN COMPARISON:  None. FINDINGS: Osseous: No acute fracture or suspicious osseous lesion. Multiple dental caries including large caries involving the right greater than left maxillary and right mandibular molar teeth. No evidence of a subperiosteal abscess. Orbits: Unremarkable. Sinuses: Small volume secretions in the maxillary sinuses. Clear mastoid air cells. Soft tissues: No significant inflammatory changes or fluid collection identified in the maxillofacial soft  tissues. Limited intracranial: Unremarkable. IMPRESSION: Poor dentition. No evidence of a subperiosteal abscess or other acute abnormality. Electronically Signed   By: Sebastian Ache M.D.   On: 08/14/2020 15:44    Labs Reviewed  CBC WITH DIFFERENTIAL/PLATELET - Abnormal; Notable for the following components:      Result Value   RBC 4.13 (*)    Hemoglobin 12.9 (*)    All other components within normal limits  COMPREHENSIVE METABOLIC PANEL - Abnormal; Notable for the following components:   Total Protein 6.1 (*)    Alkaline Phosphatase 34 (*)    All other components within normal limits     MDM  Plan is to follow-up on CT scan, patient has dental appointment on Monday.  CT scan without evidence of significant infection or other acute abnormalities.  Does show dental caries which may be the underlying cause of patient's pain.  He states that his pain is worse after getting Norco.  He has not had any ibuprofen in the past 6 hours and has no known allergies.  He is requesting additional pain medicine.  We will give a dose of Toradol and discharge.  Conservative care discussed with patient.  He has drinks at bed side that he has been tolerating.   Return precautions were discussed with patient who states their understanding.  At the time of discharge patient denied any unaddressed complaints or concerns.  Patient is agreeable for discharge home.  Note: Portions of this report may have been transcribed using voice recognition software. Every effort was made to ensure accuracy; however, inadvertent computerized transcription errors may be present  Cristina Gong, PA-C 08/14/20 1608    Sabas Sous, MD 08/14/20 [phone]

## 2020-08-14 NOTE — Progress Notes (Signed)
Based on what you shared with me, I feel your condition warrants further evaluation and I recommend that you be seen for a face to face office visit. Dental infections can progress to severe deep tissue infections in your neck or throat.  Given the severity of your symptoms, you need a face to face evaluation to ensure no life threatening deep tissue infections.  Please be seen at an urgent care or in the emergency department TODAY for evaluation.   NOTE: If you entered your credit card information for this eVisit, you will not be charged. You may see a "hold" on your card for the $35 but that hold will drop off and you will not have a charge processed.   If you are having a true medical emergency please call 911.      For an urgent face to face visit, [facility] has five urgent care centers for your convenience:     [facility] at [facility] Directions [phone] [address] [place], [place] [address] . 10 am - 6pm Monday - Friday    [facility]) Get Driving Directions [phone] [address] [place], [place] [address] . 10 am to 8 pm Monday-Friday . 12 pm to 8 pm [facility] at [facility] Get Driving Directions [phone], [address] [place], [place] [address] . 8 am to 8 pm Monday-Friday . 9 am to 6 pm Saturday . 11 am to 6 pm Sunday     [facility] at [facility] Get Driving Directions  [phone] [address] [place], [place] [address] . 8 am to 8 pm Monday-Friday . 8 am to 4 pm [facility] Urgent Care at [facility] Directions [phone] [address] [place], [place] [address] . 12 pm to 6 pm Monday-Friday      Your e-visit answers were reviewed by a board certified advanced clinical practitioner to complete your personal care plan.  Thank you for  using e-Visits.   Greater than 5 minutes, yet less than 10 minutes of time have been spent researching, coordinating, and implementing care for this patient today

## 2020-08-17 MED ORDER — SILDENAFIL CITRATE 100 MG PO TABS: 50.0000 mg | ORAL_TABLET | Freq: Every day | ORAL | 11 refills | Status: DC | PRN

## 2020-08-18 ENCOUNTER — Other Ambulatory Visit: Payer: Self-pay | Admitting: Osteopathic Medicine

## 2020-09-01 ENCOUNTER — Other Ambulatory Visit: Payer: Self-pay | Admitting: Family Medicine

## 2020-09-07 ENCOUNTER — Telehealth: Payer: Self-pay

## 2020-09-07 NOTE — Telephone Encounter (Signed)
Attempted to submit prior authorization for Advair Diskus 250/50 mcg. Per covermymeds - member not found. Covered formulary that does not required prior authorizations are:  Atrovent HFA Flovent HFA Anoro Ellipta Dulera Advair HFA Combivent Respimat Stiolto Respimat [employer]

## 2020-10-05 ENCOUNTER — Other Ambulatory Visit: Payer: Self-pay | Admitting: Family Medicine

## 2020-10-15 ENCOUNTER — Ambulatory Visit: Payer: Medicaid Other | Admitting: Family Medicine

## 2020-10-16 ENCOUNTER — Encounter: Payer: Self-pay | Admitting: Medical-Surgical

## 2020-10-16 ENCOUNTER — Ambulatory Visit (INDEPENDENT_AMBULATORY_CARE_PROVIDER_SITE_OTHER): Payer: Self-pay | Admitting: Medical-Surgical

## 2020-10-16 ENCOUNTER — Other Ambulatory Visit: Payer: Self-pay

## 2020-10-16 VITALS — BP 124/88 | HR 110 | Temp 98.8°F | Resp 20 | Ht 70.47 in | Wt 298.0 lb

## 2020-10-16 DIAGNOSIS — F5105 Insomnia due to other mental disorder: Secondary | ICD-10-CM

## 2020-10-16 DIAGNOSIS — G43109 Migraine with aura, not intractable, without status migrainosus: Secondary | ICD-10-CM

## 2020-10-16 DIAGNOSIS — K219 Gastro-esophageal reflux disease without esophagitis: Secondary | ICD-10-CM

## 2020-10-16 MED ORDER — PANTOPRAZOLE SODIUM 40 MG PO TBEC: 40.0000 mg | DELAYED_RELEASE_TABLET | Freq: Every day | ORAL | 3 refills | Status: DC

## 2020-10-16 MED ORDER — SUMATRIPTAN SUCCINATE 100 MG PO TABS: 100.0000 mg | ORAL_TABLET | Freq: Every day | ORAL | 0 refills | Status: DC | PRN

## 2020-10-16 MED ORDER — QUETIAPINE FUMARATE 100 MG PO TABS: 100.0000 mg | ORAL_TABLET | Freq: Every day | ORAL | 0 refills | Status: DC

## 2020-10-16 MED ORDER — METOPROLOL TARTRATE 25 MG PO TABS: 25.0000 mg | ORAL_TABLET | Freq: Two times a day (BID) | ORAL | 3 refills | Status: DC

## 2020-10-16 NOTE — Patient Instructions (Signed)
Migraine Headache A migraine headache is an intense, throbbing pain on one side or both sides of the head. Migraine headaches may also cause other symptoms, such as nausea, vomiting, and sensitivity to light and noise. A migraine headache can last from 4 hours to 3 days. Talk with your doctor about what things may bring on (trigger) your migraine headaches. What are the causes? The exact cause of this condition is not known. However, a migraine may be caused when nerves in the brain become irritated and release chemicals that cause inflammation of blood vessels. This inflammation causes pain. This condition may be triggered or caused by:  Drinking alcohol.  Smoking.  Taking medicines, such as: ? Medicine used to treat chest pain (nitroglycerin). ? Birth control pills. ? Estrogen. ? Certain blood pressure medicines.  Eating or drinking products that contain nitrates, glutamate, aspartame, or tyramine. Aged cheeses, chocolate, or caffeine may also be triggers.  Doing physical activity. Other things that may trigger a migraine headache include:  Menstruation.  Pregnancy.  Hunger.  Stress.  Lack of sleep or too much sleep.  Weather changes.  Fatigue. What increases the risk? The following factors may make you more likely to experience migraine headaches:  Being a certain age. This condition is more common in people who are 26-74 years old.  Being male.  Having a family history of migraine headaches.  Being Caucasian.  Having a mental health condition, such as depression or anxiety.  Being obese. What are the signs or symptoms? The main symptom of this condition is pulsating or throbbing pain. This pain may:  Happen in any area of the head, such as on one side or both sides.  Interfere with daily activities.  Get worse with physical activity.  Get worse with exposure to bright lights or loud noises. Other symptoms may  include:  Nausea.  Vomiting.  Dizziness.  General sensitivity to bright lights, loud noises, or smells. Before you get a migraine headache, you may get warning signs (an aura). An aura may include:  Seeing flashing lights or having blind spots.  Seeing bright spots, halos, or zigzag lines.  Having tunnel vision or blurred vision.  Having numbness or a tingling feeling.  Having trouble talking.  Having muscle weakness. Some people have symptoms after a migraine headache (postdromal phase), such as:  Feeling tired.  Difficulty concentrating. How is this diagnosed? A migraine headache can be diagnosed based on:  Your symptoms.  A physical exam.  Tests, such as: ? CT scan or an MRI of the head. These imaging tests can help rule out other causes of headaches. ? Taking fluid from the spine (lumbar puncture) and analyzing it (cerebrospinal fluid analysis, or CSF analysis). How is this treated? This condition may be treated with medicines that:  Relieve pain.  Relieve nausea.  Prevent migraine headaches. Treatment for this condition may also include:  Acupuncture.  Lifestyle changes like avoiding foods that trigger migraine headaches.  Biofeedback.  Cognitive behavioral therapy. Follow these instructions at home: Medicines  Take over-the-counter and prescription medicines only as told by your health care provider.  Ask your health care provider if the medicine prescribed to you: ? Requires you to avoid driving or using heavy machinery. ? Can cause constipation. You may need to take these actions to prevent or treat constipation:  Drink enough fluid to keep your urine pale yellow.  Take over-the-counter or prescription medicines.  Eat foods that are high in fiber, such as beans, whole grains, and  fresh fruits and vegetables.  Limit foods that are high in fat and processed sugars, such as fried or sweet foods. Lifestyle  Do not drink alcohol.  Do not  use any products that contain nicotine or tobacco, such as cigarettes, e-cigarettes, and chewing tobacco. If you need help quitting, ask your health care provider.  Get at least 8 hours of sleep every night.  Find ways to manage stress, such as meditation, deep breathing, or yoga. General instructions  Keep a journal to find out what may trigger your migraine headaches. For example, write down: ? What you eat and drink. ? How much sleep you get. ? Any change to your diet or medicines.  If you have a migraine headache: ? Avoid things that make your symptoms worse, such as bright lights. ? It may help to lie down in a dark, quiet room. ? Do not drive or use heavy machinery. ? Ask your health care provider what activities are safe for you while you are experiencing symptoms.  Keep all follow-up visits as told by your health care provider. This is important.      Contact a health care provider if:  You develop symptoms that are different or more severe than your usual migraine headache symptoms.  You have more than 15 headache days in one month. Get help right away if:  Your migraine headache becomes severe.  Your migraine headache lasts longer than 72 hours.  You have a fever.  You have a stiff neck.  You have vision loss.  Your muscles feel weak or like you cannot control them.  You start to lose your balance often.  You have trouble walking.  You faint.  You have a seizure. Summary  A migraine headache is an intense, throbbing pain on one side or both sides of the head. Migraines may also cause other symptoms, such as nausea, vomiting, and sensitivity to light and noise.  This condition may be treated with medicines and lifestyle changes. You may also need to avoid certain things that trigger a migraine headache.  Keep a journal to find out what may trigger your migraine headaches.  Contact your health care provider if you have more than 15 headache days in a  month or you develop symptoms that are different or more severe than your usual migraine headache symptoms. This information is not intended to replace advice given to you by your health care provider. Make sure you discuss any questions you have with your health care provider. Document Revised: 11/30/2018 Document Reviewed: 09/20/2018 Elsevier Patient Education  2021 [employer] Food Choices for Gastroesophageal Reflux Disease, Adult When you have gastroesophageal reflux disease (GERD), the foods you eat and your eating habits are very important. Choosing the right foods can help ease your discomfort. Think about working with a food expert (dietitian) to help you make good choices. What are tips for following this plan? Reading food labels  Look for foods that are low in saturated fat. Foods that may help with your symptoms include: ? Foods that have less than 5% of daily value (DV) of fat. ? Foods that have 0 grams of trans fat. Cooking  Do not fry your food.  Cook your food by baking, steaming, grilling, or broiling. These are all methods that do not need a lot of fat for cooking.  To add flavor, try to use herbs that are low in spice and acidity. Meal planning  Choose healthy foods that are low in fat, such as: ?  Fruits and vegetables. ? Whole grains. ? Low-fat dairy products. ? Lean meats, fish, and poultry.  Eat small meals often instead of eating 3 large meals each day. Eat your meals slowly in a place where you are relaxed. Avoid bending over or lying down until 2-3 hours after eating.  Limit high-fat foods such as fatty meats or fried foods.  Limit your intake of fatty foods, such as oils, butter, and shortening.  Avoid the following as told by your doctor: ? Foods that cause symptoms. These may be different for different people. Keep a food diary to keep track of foods that cause symptoms. ? Alcohol. ? Drinking a lot of liquid with meals. ? Eating meals during the  2-3 hours before bed.   Lifestyle  Stay at a healthy weight. Ask your doctor what weight is healthy for you. If you need to lose weight, work with your doctor to do so safely.  Exercise for at least 30 minutes on 5 or more days each week, or as told by your doctor.  Wear loose-fitting clothes.  Do not smoke or use any products that contain nicotine or tobacco. If you need help quitting, ask your doctor.  Sleep with the head of your bed higher than your feet. Use a wedge under the mattress or blocks under the bed frame to raise the head of the bed.  Chew sugar-free gum after meals. What foods should eat? Eat a healthy, well-balanced diet of fruits, vegetables, whole grains, low-fat dairy products, lean meats, fish, and poultry. Each person is different. Foods that may cause symptoms in one person may not cause any symptoms in another person. Work with your doctor to find foods that are safe for you. The items listed above may not be a complete list of what you can eat and drink. Contact a food expert for more options.   What foods should I avoid? Limiting some of these foods may help in managing the symptoms of GERD. Everyone is different. Talk with a food expert or your doctor to help you find the exact foods to avoid, if any. Fruits Any fruits prepared with added fat. Any fruits that cause symptoms. For some people, this may include citrus fruits, such as oranges, grapefruit, pineapple, and lemons. Vegetables Deep-fried vegetables. [place] fries. Any vegetables prepared with added fat. Any vegetables that cause symptoms. For some people, this may include tomatoes and tomato products, chili peppers, onions and garlic, and horseradish. Grains Pastries or quick breads with added fat. Meats and other proteins High-fat meats, such as fatty beef or pork, hot dogs, ribs, ham, sausage, salami, and bacon. Fried meat or protein, including fried fish and fried chicken. Nuts and nut butters, in large  amounts. Dairy Whole milk and chocolate milk. Sour cream. Cream. Ice cream. Cream cheese. Milkshakes. Fats and oils Butter. Margarine. Shortening. Ghee. Beverages Coffee and tea, with or without caffeine. Carbonated beverages. Sodas. Energy drinks. Fruit juice made with acidic fruits, such as orange or grapefruit. Tomato juice. Alcoholic drinks. Sweets and desserts Chocolate and cocoa. Donuts. Seasonings and condiments Pepper. Peppermint and spearmint. Added salt. Any condiments, herbs, or seasonings that cause symptoms. For some people, this may include curry, hot sauce, or vinegar-based salad dressings. The items listed above may not be a complete list of what you should not eat and drink. Contact a food expert for more options. Questions to ask your doctor Diet and lifestyle changes are often the first steps that are taken to manage symptoms of  GERD. If diet and lifestyle changes do not help, talk with your doctor about taking medicines. Where to find more information  International Foundation for Gastrointestinal Disorders: aboutgerd.org Summary  When you have GERD, food and lifestyle choices are very important in easing your symptoms.  Eat small meals often instead of 3 large meals a day. Eat your meals slowly and in a place where you are relaxed.  Avoid bending over or lying down until 2-3 hours after eating.  Limit high-fat foods such as fatty meats or fried foods. This information is not intended to replace advice given to you by your health care provider. Make sure you discuss any questions you have with your health care provider. Document Revised: 02/17/2020 Document Reviewed: 02/17/2020 Elsevier Patient Education  2021 [employer].

## 2020-10-16 NOTE — Progress Notes (Signed)
Subjective:    CC: migraine, reflux  HPI: Pleasant 30 year old male accompanied by his significant other presenting to discuss:  Migraines- reports having very frequent severe headaches approximately 20 days per month for several years. Headaches accompanied by eye pain, photophobia/phonophobia, nausea. Pain occurs in the frontal area and sometimes includes the back of his head and shoulder. Has tried taking a relative's dose of Zomig and this is helpful but the headache returns the next day. Has never tried preventative medication or other abortive agents.   GI s/s- having frequent issues with reflux including nausea, bloating, increased flatulence, and abdominal pain. Taking OTC Prilosec without relief. Admits to taking one of his significant other's protonix on occasion which helps some.   Requesting a refill on Seroquel as he reports his significant other spilled them about a month ago. Has not had the medication for about a month and is not sleeping well which is likely contributing to his headaches.   I reviewed the past medical history, family history, social history, surgical history, and allergies today and no changes were needed.  Please see the problem list section below in epic for further details.  Past Medical History: Past Medical History:  Diagnosis Date  . Anxiety   . Asthma   . Depression   . Drug use   . Hypertension    Past Surgical History: Past Surgical History:  Procedure Laterality Date  . FRACTURE SURGERY     Social History: Social History   Socioeconomic History  . Marital status: Significant Other    Spouse name: Not on file  . Number of children: Not on file  . Years of education: Not on file  . Highest education level: Not on file  Occupational History  . Occupation: Server  Tobacco Use  . Smoking status: Former Smoker    Packs/day: 1.00    Years: 3.00    Pack years: 3.00    Types: Cigarettes    Quit date: 08/22/2017    Years since quitting:  3.1  . Smokeless tobacco: Never Used  Vaping Use  . Vaping Use: Every day  Substance and Sexual Activity  . Alcohol use: Yes    Alcohol/week: 1.0 - 2.0 standard drink    Types: 1 - 2 Standard drinks or equivalent per week  . Drug use: Yes    Frequency: 2.0 times per week    Types: Marijuana  . Sexual activity: Yes    Partners: Female    Birth control/protection: None  Other Topics Concern  . Not on file  Social History Narrative  . Not on file   Social Determinants of Health   Financial Resource Strain: Not on file  Food Insecurity: Not on file  Transportation Needs: Not on file  Physical Activity: Not on file  Stress: Not on file  Social Connections: Not on file   Family History: Family History  Problem Relation Age of Onset  . Hypertension Mother   . Anxiety disorder Mother   . Hypertension Maternal Grandmother   . Anxiety disorder Maternal Grandmother   . Hypertension Maternal Uncle   . Anxiety disorder Maternal Uncle    Allergies: No Known Allergies Medications: See med rec.  Review of Systems: See HPI for pertinent positives and negatives.   Objective:    General: Well Developed, well nourished, and in no acute distress.  Neuro: Alert and oriented x3  HEENT: Normocephalic, atraumatic.  Skin: Warm and dry. Cardiac: Regular rate and rhythm, no murmurs rubs or gallops, no  lower extremity edema.  Respiratory: Clear to auscultation bilaterally. Not using accessory muscles, speaking in full sentences.  Impression and Recommendations:    1. Gastroesophageal reflux disease without esophagitis Symptoms consistent with GERD. Food guide for GERD provided with AVS. Stop Prilosec. Start Protonix 40mg  daily.   2. Migraine with aura and without status migrainosus, not intractable Discussed prevention vs. Abortion. Agreeable to start both in hopes that this will get him some relief from the frequency of headaches. With his history of HTN and Anxiety, a beta-blocker  would be a good place to start. Starting metoprolol at 25mg  BID although this will likely have to be titrated up. Sending a short supply of Sumitriptan 100mg  daily prn. Advised patient to avoid taking more than one dose per day.   3. Insomnia Refilling Seroquel.  Return in about 3 weeks (around 11/06/2020) for migraine follow up. ___________________________________________ , DNP, APRN, FNP-BC Primary Care and Sports Medicine [facility]

## 2020-11-16 ENCOUNTER — Other Ambulatory Visit: Payer: Self-pay | Admitting: Medical-Surgical

## 2020-11-16 ENCOUNTER — Other Ambulatory Visit: Payer: Self-pay | Admitting: Family Medicine

## 2020-11-16 ENCOUNTER — Other Ambulatory Visit: Payer: Self-pay | Admitting: Nurse Practitioner

## 2020-11-16 DIAGNOSIS — R0609 Other forms of dyspnea: Secondary | ICD-10-CM

## 2020-11-16 DIAGNOSIS — R0600 Dyspnea, unspecified: Secondary | ICD-10-CM

## 2020-11-16 DIAGNOSIS — R059 Cough, unspecified: Secondary | ICD-10-CM

## 2020-11-16 NOTE — Telephone Encounter (Signed)
Routing to PCP

## 2020-12-02 ENCOUNTER — Other Ambulatory Visit: Payer: Self-pay | Admitting: Family Medicine

## 2020-12-21 ENCOUNTER — Other Ambulatory Visit: Payer: Self-pay

## 2020-12-21 ENCOUNTER — Encounter: Payer: Self-pay | Admitting: Family Medicine

## 2020-12-21 ENCOUNTER — Ambulatory Visit (INDEPENDENT_AMBULATORY_CARE_PROVIDER_SITE_OTHER): Payer: Self-pay | Admitting: Family Medicine

## 2020-12-21 VITALS — BP 112/66 | HR 71 | Temp 97.9°F | Ht 70.0 in | Wt 318.0 lb

## 2020-12-21 DIAGNOSIS — M25542 Pain in joints of left hand: Secondary | ICD-10-CM

## 2020-12-21 DIAGNOSIS — M25541 Pain in joints of right hand: Secondary | ICD-10-CM

## 2020-12-21 DIAGNOSIS — R6882 Decreased libido: Secondary | ICD-10-CM

## 2020-12-21 DIAGNOSIS — F411 Generalized anxiety disorder: Secondary | ICD-10-CM

## 2020-12-21 DIAGNOSIS — K219 Gastro-esophageal reflux disease without esophagitis: Secondary | ICD-10-CM

## 2020-12-21 DIAGNOSIS — K047 Periapical abscess without sinus: Secondary | ICD-10-CM

## 2020-12-21 MED ORDER — TRAMADOL HCL 50 MG PO TABS: 50.0000 mg | ORAL_TABLET | Freq: Three times a day (TID) | ORAL | 0 refills | Status: DC | PRN

## 2020-12-21 MED ORDER — PANTOPRAZOLE SODIUM 40 MG PO TBEC: 40.0000 mg | DELAYED_RELEASE_TABLET | Freq: Two times a day (BID) | ORAL | 3 refills | Status: DC

## 2020-12-21 MED ORDER — QUETIAPINE FUMARATE 100 MG PO TABS: 100.0000 mg | ORAL_TABLET | Freq: Every day | ORAL | 1 refills | Status: DC

## 2020-12-21 MED ORDER — AMOXICILLIN-POT CLAVULANATE 875-125 MG PO TABS: 1.0000 | ORAL_TABLET | Freq: Two times a day (BID) | ORAL | 0 refills | Status: DC

## 2020-12-21 MED ORDER — TRAMADOL HCL 50 MG PO TABS: 50.0000 mg | ORAL_TABLET | Freq: Three times a day (TID) | ORAL | 0 refills | Status: AC | PRN

## 2020-12-21 NOTE — Patient Instructions (Signed)
Dental Pain Dental pain is often a sign that something is wrong with your teeth or gums. You can also have pain after a dental treatment. If you have dental pain, it is important to contact your dentist, especially if the cause of the pain is not known. Dental pain may hurt a lot or a little and can be caused by many things, including:  Tooth decay (cavities or caries).  Infection.  The inner part of the tooth being filled with pus (an abscess).  Injury.  A crack in the tooth.  Gums that move back and expose the root of a tooth.  Gum disease.  Abnormal grinding or clenching of teeth.  Not taking good care of your teeth. Sometimes the cause of pain is not known. You may have pain all the time, or it may happen only when you are:  Chewing.  Exposed to hot or cold temperatures.  Eating or drinking foods or drinks that have a lot of sugar in them, such as soda or candy. Follow these instructions at home: Medicines  Take over-the-counter and prescription medicines only as told by your dentist.  If you were prescribed an antibiotic medicine, take it as told by your dentist. Do not stop taking it even if you start to feel better. Eating and drinking Do not eat foods or drinks that cause you pain. These include:  Very hot or very cold foods or drinks.  Sweet or sugary foods or drinks. Managing pain and swelling  If told, put ice on the painful area of your face. To do this: ? Put ice in a plastic bag. ? Place a towel between your skin and the bag. ? Leave the ice on for 20 minutes, 2-3 times a day. ? Take off the ice if your skin turns bright red. This is very important. If you cannot feel pain, heat, or cold, you have a greater risk of damage to the area.   Brushing your teeth  Brush your teeth twice a day using a fluoride toothpaste.  Use a toothpaste made for sensitive teeth as told by your dentist.  Use a soft toothbrush. General instructions  Floss your teeth at  least once a day.  Do not put heat on the outside of your face.  Rinse your mouth often with salt water. To make salt water, dissolve -1 tsp (3-6 g) of salt in 1 cup (237 mL) of warm water.  Watch your dental pain. Let your dentist know if there are any changes.  Keep all follow-up visits. Contact a dentist if:  You have dental pain and you do not know why.  Medicine does not help your pain.  Your symptoms get worse.  You have new symptoms. Get help right away if:  You cannot open your mouth.  You are having trouble breathing or swallowing.  You have a fever.  Your face, neck, or jaw is swollen. These symptoms may be an emergency. Get help right away. Call your local emergency services (911 in the [place]  Do not wait to see if the symptoms will go away.  Do not drive yourself to the hospital. Summary  Dental pain may be caused by many things, including tooth decay, injury, or infection. In some cases, the cause is not known.  Dental pain may hurt a lot or very little. You may have pain all the time, or you may have it only when you eat or drink.  Take over-the-counter and prescription medicines only as told   by your dentist.  Watch your dental pain for any changes. Let your dentist know if symptoms get worse. This information is not intended to replace advice given to you by your health care provider. Make sure you discuss any questions you have with your health care provider. Document Revised: 05/13/2020 Document Reviewed: 05/13/2020 Elsevier Patient Education  2021 Elsevier Inc.  

## 2020-12-21 NOTE — Progress Notes (Signed)
Scott Holland - 30 y.o. male MRN [id number]  Date of birth: Nov 15, 1990  Subjective Chief Complaint  Patient presents with  . Torticollis  . Joint Pain    HPI Scott Holland is a 30 y.o. male here today with complaint of possible dental infection.   Has had pain in upper gums and cheek area with swollen lymph node on R side of neck.  Has had this with  Previous dental infections.  He has history of multiple dental caries/broken teeth. Denies fever, chills or difficulty swallowing.  It is painful for him to turn his neck.  Using ibuprofen frequently with some tylenol as well. Having increased reflux, not as well controlled with protonix.   He has also had issues with pain in his hands and fingers.  This has been going on for several months.  Has had swollen and painful joints.  No rash.  Mother has history of RA.   He also reports decreased libido.  Has some erectile issues but decreased desire as well.     Needs renewal of seroquel and protonix.    ROS:  A comprehensive ROS was completed and negative except as noted per HPI  No Known Allergies  Past Medical History:  Diagnosis Date  . Anxiety   . Asthma   . Depression   . Drug use   . Hypertension     Past Surgical History:  Procedure Laterality Date  . FRACTURE SURGERY      Social History   Socioeconomic History  . Marital status: Significant Other    Spouse name: Not on file  . Number of children: Not on file  . Years of education: Not on file  . Highest education level: Not on file  Occupational History  . Occupation: Server  Tobacco Use  . Smoking status: Former Smoker    Packs/day: 1.00    Years: 3.00    Pack years: 3.00    Types: Cigarettes    Quit date: 08/22/2017    Years since quitting: 3.3  . Smokeless tobacco: Never Used  Vaping Use  . Vaping Use: Every day  Substance and Sexual Activity  . Alcohol use: Yes    Alcohol/week: 1.0 - 2.0 standard drink    Types: 1 - 2 Standard drinks or equivalent per  week  . Drug use: Yes    Frequency: 2.0 times per week    Types: Marijuana  . Sexual activity: Yes    Partners: Female    Birth control/protection: None  Other Topics Concern  . Not on file  Social History Narrative  . Not on file   Social Determinants of Health   Financial Resource Strain: Not on file  Food Insecurity: Not on file  Transportation Needs: Not on file  Physical Activity: Not on file  Stress: Not on file  Social Connections: Not on file    Family History  Problem Relation Age of Onset  . Hypertension Mother   . Anxiety disorder Mother   . Hypertension Maternal Grandmother   . Anxiety disorder Maternal Grandmother   . Hypertension Maternal Uncle   . Anxiety disorder Maternal Uncle     Health Maintenance  Topic Date Due  . COVID-19 Vaccine (1) Never done  . TETANUS/TDAP  07/28/2021 (Originally 04/03/2010)  . Hepatitis C Screening  07/28/2021 (Originally 02-17-1991)  . HIV Screening  07/28/2021 (Originally 04/03/2006)  . INFLUENZA VACCINE  03/22/2021  . HPV VACCINES  Aged Out     ----------------------------------------------------------------------------------------------------------------------------------------------------------------------------------------------------------------- Physical Exam BP 112/66 (  BP Location: Left Arm, Patient Position: Sitting, Cuff Size: Large)   Pulse 71   Temp 97.9 F (36.6 C)   Ht 5' 10"  (1.778 m)   Wt (!) 318 lb (144.2 kg)   SpO2 99%   BMI 45.63 kg/m   Physical Exam Constitutional:      Appearance: Normal appearance.  HENT:     Head: Normocephalic.     Right Ear: Tympanic membrane normal.     Left Ear: Tympanic membrane normal.     Mouth/Throat:     Comments: Poor dentition.  Multiple caries/broken teeth.   TTP along upper jaw line, maxillary area.  R side anterior cervical adenopathy.  Cardiovascular:     Rate and Rhythm: Normal rate and regular rhythm.     Pulses: Normal pulses.     Heart sounds:  Normal heart sounds.  Musculoskeletal:     Cervical back: Neck supple.  Skin:    General: Skin is warm and dry.  Neurological:     General: No focal deficit present.     Mental Status: He is alert.     ------------------------------------------------------------------------------------------------------------------------------------------------------------------------------------------------------------------- Assessment and Plan  Dental infection Treating with augmentin x10 days.  Recommend follow up with dentist.  Discussed limiting ibuprofen due to increased reflux.  Short term tramadol for pain control.   GAD (generalized anxiety disorder) Seroquel renewed.  Some nights he needs two of these.  Will increase quantity and dosing to 100-279m qhs  GERD (gastroesophageal reflux disease) Limit NSAIDS. Increase protonix to BID dosing.   Decreased libido Possibly related to medications he is taking.  Will also check testosterone levels.   Arthralgia of both hands Orders Placed This Encounter  Procedures  . Sed Rate (ESR)  . ANA,IFA RA Diag Pnl w/rflx Tit/Patn  . Testosterone      Meds ordered this encounter  Medications  . DISCONTD: amoxicillin-clavulanate (AUGMENTIN) 875-125 MG tablet    Sig: Take 1 tablet by mouth 2 (two) times daily.    Dispense:  20 tablet    Refill:  0  . DISCONTD: traMADol (ULTRAM) 50 MG tablet    Sig: Take 1 tablet (50 mg total) by mouth every 8 (eight) hours as needed for up to 5 days.    Dispense:  15 tablet    Refill:  0  . DISCONTD: QUEtiapine (SEROQUEL) 100 MG tablet    Sig: Take 1-2 tablets (100-200 mg total) by mouth at bedtime.    Dispense:  180 tablet    Refill:  1  . DISCONTD: pantoprazole (PROTONIX) 40 MG tablet    Sig: Take 1 tablet (40 mg total) by mouth 2 (two) times daily.    Dispense:  60 tablet    Refill:  3  . traMADol (ULTRAM) 50 MG tablet    Sig: Take 1 tablet (50 mg total) by mouth every 8 (eight) hours as needed for  up to 5 days.    Dispense:  15 tablet    Refill:  0  . QUEtiapine (SEROQUEL) 100 MG tablet    Sig: Take 1-2 tablets (100-200 mg total) by mouth at bedtime.    Dispense:  180 tablet    Refill:  1  . pantoprazole (PROTONIX) 40 MG tablet    Sig: Take 1 tablet (40 mg total) by mouth 2 (two) times daily.    Dispense:  60 tablet    Refill:  3  . amoxicillin-clavulanate (AUGMENTIN) 875-125 MG tablet    Sig: Take 1 tablet by mouth 2 (two) times  daily.    Dispense:  20 tablet    Refill:  0    No follow-ups on file.    This visit occurred during the SARS-CoV-2 public health emergency.  Safety protocols were in place, including screening questions prior to the visit, additional usage of staff PPE, and extensive cleaning of exam room while observing appropriate contact time as indicated for disinfecting solutions.

## 2020-12-21 NOTE — Assessment & Plan Note (Signed)
Possibly related to medications he is taking.  Will also check testosterone levels.

## 2020-12-21 NOTE — Assessment & Plan Note (Signed)
Treating with augmentin x10 days.  Recommend follow up with dentist.  Discussed limiting ibuprofen due to increased reflux.  Short term tramadol for pain control.

## 2020-12-21 NOTE — Assessment & Plan Note (Signed)
Limit NSAIDS. Increase protonix to BID dosing.

## 2020-12-21 NOTE — Assessment & Plan Note (Signed)
Orders Placed This Encounter  Procedures  . Sed Rate (ESR)  . ANA,IFA RA Diag Pnl w/rflx Tit/Patn  . Testosterone

## 2020-12-21 NOTE — Assessment & Plan Note (Signed)
Seroquel renewed.  Some nights he needs two of these.  Will increase quantity and dosing to 100-200mg  qhs

## 2021-01-06 ENCOUNTER — Other Ambulatory Visit: Payer: Self-pay | Admitting: Family Medicine

## 2021-01-07 ENCOUNTER — Other Ambulatory Visit: Payer: Self-pay | Admitting: Family Medicine

## 2021-02-08 ENCOUNTER — Other Ambulatory Visit: Payer: Self-pay

## 2021-02-08 DIAGNOSIS — G43109 Migraine with aura, not intractable, without status migrainosus: Secondary | ICD-10-CM

## 2021-02-08 MED ORDER — SUMATRIPTAN SUCCINATE 100 MG PO TABS: ORAL_TABLET | ORAL | 0 refills | Status: DC

## 2021-02-17 ENCOUNTER — Other Ambulatory Visit: Payer: Self-pay | Admitting: Family Medicine

## 2021-04-05 ENCOUNTER — Encounter: Payer: Self-pay | Admitting: Family Medicine

## 2021-04-05 ENCOUNTER — Other Ambulatory Visit: Payer: Self-pay

## 2021-04-05 ENCOUNTER — Encounter: Payer: Self-pay | Admitting: Osteopathic Medicine

## 2021-04-05 ENCOUNTER — Telehealth (INDEPENDENT_AMBULATORY_CARE_PROVIDER_SITE_OTHER): Payer: Self-pay | Admitting: Osteopathic Medicine

## 2021-04-05 DIAGNOSIS — U071 COVID-19: Secondary | ICD-10-CM

## 2021-04-05 MED ORDER — GUAIFENESIN-CODEINE 100-10 MG/5ML PO SYRP: 5.0000 mL | ORAL_SOLUTION | Freq: Four times a day (QID) | ORAL | 0 refills | Status: DC | PRN

## 2021-04-05 MED ORDER — NIRMATRELVIR/RITONAVIR (PAXLOVID)TABLET: 3.0000 | ORAL_TABLET | Freq: Two times a day (BID) | ORAL | 0 refills | Status: AC

## 2021-04-05 MED ORDER — ALBUTEROL SULFATE HFA 108 (90 BASE) MCG/ACT IN AERS: 2.0000 | INHALATION_SPRAY | RESPIRATORY_TRACT | 3 refills | Status: AC | PRN

## 2021-04-05 MED ORDER — PANTOPRAZOLE SODIUM 40 MG PO TBEC: 40.0000 mg | DELAYED_RELEASE_TABLET | Freq: Two times a day (BID) | ORAL | 0 refills | Status: DC

## 2021-04-05 MED ORDER — ESCITALOPRAM OXALATE 10 MG PO TABS: 10.0000 mg | ORAL_TABLET | Freq: Every day | ORAL | 3 refills | Status: DC

## 2021-04-05 NOTE — Progress Notes (Signed)
Telemedicine Visit via  Audio only - telephone (patient preference /  technical difficulty with MyChart video application)  I connected with Scott Holland on 04/05/21 at 2:38 PM  by phone or  telemedicine application as noted above  I verified that I am speaking with or regarding  the correct patient using two identifiers.  Participants: Myself, Dr Sunnie Nielsen DO Patient: Scott Holland Patient proxy if applicable: none Other, if applicable: none  Patient is at home I am in office at [facility]    I discussed the limitations of evaluation and management  by telemedicine and the availability of in person appointments.  The participant(s) above expressed understanding and  agreed to proceed with this appointment via telemedicine.       History of Present Illness: Scott Holland is a 30 y.o. male who would like to discuss COVID.  Tested positive yesterday, requesting work note so that he can remain isolated.  Reports some coughing issues and sinus/chest congestion.  History of asthma, he has been taking OTC Delsym, cough drops, Mucinex     Observations/Objective: There were no vitals taken for this visit. BP Readings from Last 3 Encounters:  12/21/20 112/66  10/16/20 124/88  08/14/20 (!) 145/79   Exam: Normal Speech.  NAD  Lab and Radiology Results No results found for this or any previous visit (from the past 72 hour(s)). No results found.     Assessment and Plan: 30 y.o. male with The encounter diagnosis was COVID-19.  COVID in relatively low risk patient, sent cough medication, refilled albuterol, sent in antivirals since he is within the window for these.  Refilled Lexapro and PPI.  PDMP not reviewed this encounter. No orders of the defined types were placed in this encounter.  Meds ordered this encounter  Medications   guaiFENesin-codeine (ROBITUSSIN AC) 100-10 MG/5ML syrup    Sig: Take 5-10 mLs by mouth 4 (four) times daily as needed  for cough or congestion.    Dispense:  180 mL    Refill:  0   albuterol (VENTOLIN HFA) 108 (90 Base) MCG/ACT inhaler    Sig: Inhale 2 puffs into the lungs every 4 (four) hours as needed for wheezing or shortness of breath.    Dispense:  18 g    Refill:  3   escitalopram (LEXAPRO) 10 MG tablet    Sig: Take 1 tablet (10 mg total) by mouth daily.    Dispense:  90 tablet    Refill:  3   pantoprazole (PROTONIX) 40 MG tablet    Sig: Take 1 tablet (40 mg total) by mouth 2 (two) times daily.    Dispense:  60 tablet    Refill:  0   nirmatrelvir/ritonavir EUA (PAXLOVID) TABS    Sig: Take 3 tablets by mouth 2 (two) times daily for 5 days. (Take nirmatrelvir 150 mg two tablets twice daily for 5 days and ritonavir 100 mg one tablet twice daily for 5 days)    Dispense:  30 tablet    Refill:  0   There are no Patient Instructions on file for this visit.  Instructions sent via MyChart.   Follow Up Instructions: Return if symptoms worsen or fail to improve / as directed by PCP.    I discussed the assessment and treatment plan with the patient. The patient was provided an opportunity to ask questions and all were answered. The patient agreed with the plan and demonstrated an understanding of the instructions.   The patient was advised to  call back or seek an in-person evaluation if any new concerns, if symptoms worsen or if the condition fails to improve as anticipated.  21 minutes of non-face-to-face time was provided during this encounter.      . . . . . . . . . . . . . Marland Kitchen                   Historical information moved to improve visibility of documentation.  Past Medical History:  Diagnosis Date   Anxiety    Asthma    Depression    Drug use    Hypertension    Past Surgical History:  Procedure Laterality Date   FRACTURE SURGERY     Social History   Tobacco Use   Smoking status: Former    Packs/day: 1.00    Years: 3.00    Pack years: 3.00     Types: Cigarettes    Quit date: 08/22/2017    Years since quitting: 3.6   Smokeless tobacco: Never  Substance Use Topics   Alcohol use: Yes    Alcohol/week: 1.0 - 2.0 standard drink    Types: 1 - 2 Standard drinks or equivalent per week   family history includes Anxiety disorder in his maternal grandmother, maternal uncle, and mother; Hypertension in his maternal grandmother, maternal uncle, and mother.  Medications: Current Outpatient Medications  Medication Sig Dispense Refill   acetaminophen (TYLENOL) 500 MG tablet Take 1,000 mg by mouth every 8 (eight) hours as needed (pain).     Ascorbic Acid (VITAMIN C PO) Take 1 tablet by mouth daily.     buPROPion (WELLBUTRIN XL) 150 MG 24 hr tablet TAKE 1 TABLET BY MOUTH DAILY 90 tablet 0   busPIRone (BUSPAR) 15 MG tablet TAKE 1 TABLET BY MOUTH 3 TIMES DAILY 90 tablet 1   fluticasone (FLOVENT HFA) 110 MCG/ACT inhaler Inhale 2 puffs into the lungs in the morning and at bedtime. Use immediately after salmeterol (Serevent) inhaler. Rinse mouth after each use. (Patient taking differently: Inhale 2 puffs into the lungs in the morning and at bedtime. Rinse mouth after each use.) 12 g 2   guaiFENesin-codeine (ROBITUSSIN AC) 100-10 MG/5ML syrup Take 5-10 mLs by mouth 4 (four) times daily as needed for cough or congestion. 180 mL 0   ibuprofen (ADVIL) 200 MG tablet Take 800 mg by mouth every 8 (eight) hours as needed (pain).     lisinopril (ZESTRIL) 20 MG tablet TAKE 1 TABLET BY MOUTH EVERY DAY 90 tablet 1   metoprolol tartrate (LOPRESSOR) 25 MG tablet Take 1 tablet (25 mg total) by mouth 2 (two) times daily. 180 tablet 3   montelukast (SINGULAIR) 10 MG tablet TAKE 1 TABLET BY MOUTH AT BEDTIME 30 tablet 3   Multiple Vitamin (MULTIVITAMIN WITH MINERALS) TABS tablet Take 1 tablet by mouth daily.     nirmatrelvir/ritonavir EUA (PAXLOVID) TABS Take 3 tablets by mouth 2 (two) times daily for 5 days. (Take nirmatrelvir 150 mg two tablets twice daily for 5 days and  ritonavir 100 mg one tablet twice daily for 5 days) 30 tablet 0   QUEtiapine (SEROQUEL) 100 MG tablet Take 1-2 tablets (100-200 mg total) by mouth at bedtime. 180 tablet 1   sildenafil (VIAGRA) 100 MG tablet Take 0.5-1 tablets (50-100 mg total) by mouth daily as needed for erectile dysfunction. 20 tablet 11   SUMAtriptan (IMITREX) 100 MG tablet TAKE 1 TABLET BY MOUTH DAILY AS NEEDED FOR MIGRAINE 12 tablet 0   albuterol (VENTOLIN  HFA) 108 (90 Base) MCG/ACT inhaler Inhale 2 puffs into the lungs every 4 (four) hours as needed for wheezing or shortness of breath. 18 g 3   escitalopram (LEXAPRO) 10 MG tablet Take 1 tablet (10 mg total) by mouth daily. 90 tablet 3   pantoprazole (PROTONIX) 40 MG tablet Take 1 tablet (40 mg total) by mouth 2 (two) times daily. 60 tablet 0   No current facility-administered medications for this visit.   No Known Allergies   If phone visit, billing and coding can please add appropriate modifier if needed

## 2021-05-06 ENCOUNTER — Ambulatory Visit (INDEPENDENT_AMBULATORY_CARE_PROVIDER_SITE_OTHER): Payer: Self-pay

## 2021-05-06 ENCOUNTER — Ambulatory Visit (INDEPENDENT_AMBULATORY_CARE_PROVIDER_SITE_OTHER): Payer: Self-pay | Admitting: Sports Medicine

## 2021-05-06 ENCOUNTER — Other Ambulatory Visit: Payer: Self-pay

## 2021-05-06 DIAGNOSIS — W010XXA Fall on same level from slipping, tripping and stumbling without subsequent striking against object, initial encounter: Secondary | ICD-10-CM

## 2021-05-06 DIAGNOSIS — S6992XA Unspecified injury of left wrist, hand and finger(s), initial encounter: Secondary | ICD-10-CM

## 2021-05-06 DIAGNOSIS — Z09 Encounter for follow-up examination after completed treatment for conditions other than malignant neoplasm: Secondary | ICD-10-CM

## 2021-05-06 DIAGNOSIS — S8991XA Unspecified injury of right lower leg, initial encounter: Secondary | ICD-10-CM | POA: Insufficient documentation

## 2021-05-06 DIAGNOSIS — S59912A Unspecified injury of left forearm, initial encounter: Secondary | ICD-10-CM

## 2021-05-06 MED ORDER — NAPROXEN 500 MG PO TABS
500.0000 mg | ORAL_TABLET | Freq: Two times a day (BID) | ORAL | 3 refills | Status: AC
Start: 2021-05-06 — End: 2022-05-06

## 2021-05-06 MED ORDER — TRAMADOL HCL 50 MG PO TABS: 50.0000 mg | ORAL_TABLET | Freq: Three times a day (TID) | ORAL | 0 refills | Status: DC | PRN
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
## 2021-05-10 ENCOUNTER — Telehealth: Payer: Self-pay | Admitting: Family Medicine

## 2021-05-10 ENCOUNTER — Other Ambulatory Visit: Payer: Self-pay

## 2021-05-10 ENCOUNTER — Ambulatory Visit (INDEPENDENT_AMBULATORY_CARE_PROVIDER_SITE_OTHER): Payer: Self-pay

## 2021-05-10 DIAGNOSIS — S8991XA Unspecified injury of right lower leg, initial encounter: Secondary | ICD-10-CM

## 2021-05-10 MED ORDER — TRAMADOL HCL 50 MG PO TABS: 100.0000 mg | ORAL_TABLET | Freq: Three times a day (TID) | ORAL | 0 refills | Status: DC | PRN

## 2021-05-10 NOTE — Telephone Encounter (Signed)
Double it to 2 tramadol 3 times a day, I will call in a refill, may supplement with acetaminophen, we cannot go to the schedule II controlled substances such as hydrocodone and oxycodone.

## 2021-05-10 NOTE — Telephone Encounter (Signed)
Patient aware of Dr. Karie Schwalbe recommendations below. He states that he's had tramadol in the past and it doesn't do anything for his pain. Reiterated Dr. Karie Schwalbe recommendations. Offered to transfer patient to front desk to schedule injection, he said he would call back.

## 2021-05-10 NOTE — Telephone Encounter (Signed)
Patient came in office and said he had his MRI done & that the tramadol you prescribed is not working at all, it isn't touching the pain he said and he didn't know if something else could be called in for his pain. AM       [id number] - [place], [place] - [address] Phone:  [phone]  Fax:  [phone]

## 2021-05-19 ENCOUNTER — Ambulatory Visit: Payer: Self-pay

## 2021-05-19 ENCOUNTER — Ambulatory Visit (INDEPENDENT_AMBULATORY_CARE_PROVIDER_SITE_OTHER): Payer: Self-pay

## 2021-05-19 ENCOUNTER — Other Ambulatory Visit: Payer: Self-pay

## 2021-05-19 ENCOUNTER — Ambulatory Visit (INDEPENDENT_AMBULATORY_CARE_PROVIDER_SITE_OTHER): Payer: Self-pay | Admitting: Sports Medicine

## 2021-05-19 DIAGNOSIS — S8991XA Unspecified injury of right lower leg, initial encounter: Secondary | ICD-10-CM

## 2021-05-19 DIAGNOSIS — S6992XD Unspecified injury of left wrist, hand and finger(s), subsequent encounter: Secondary | ICD-10-CM

## 2021-05-19 DIAGNOSIS — M542 Cervicalgia: Secondary | ICD-10-CM

## 2021-05-19 DIAGNOSIS — S8991XD Unspecified injury of right lower leg, subsequent encounter: Secondary | ICD-10-CM

## 2021-05-19 DIAGNOSIS — G8929 Other chronic pain: Secondary | ICD-10-CM

## 2021-05-19 MED ORDER — CYCLOBENZAPRINE HCL 10 MG PO TABS: ORAL_TABLET | ORAL | 0 refills | Status: DC

## 2021-05-19 MED ORDER — TRAMADOL HCL 50 MG PO TABS: 100.0000 mg | ORAL_TABLET | Freq: Three times a day (TID) | ORAL | 1 refills | Status: DC | PRN

## 2021-05-19 NOTE — Addendum Note (Signed)
Addended by: Monica Becton on: 05/19/2021 11:58 AM   Modules accepted: Orders

## 2021-05-19 NOTE — Assessment & Plan Note (Signed)
Lateral meniscal tear noted on MRI, injected today, return in a month, continue reaction knee brace. Continue naproxen, refilling tramadol, he understands we will not be going to medications stronger than tramadol, and this will only be meant for 2 months.

## 2021-05-19 NOTE — Assessment & Plan Note (Signed)
Neck pain, mostly axial without radicular component, adding x-rays, formal physical therapy, a week and Flexeril. Return to see me after 6 weeks, we will proceed with MRI if not sufficiently better.
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
## 2021-05-29 ENCOUNTER — Other Ambulatory Visit: Payer: Self-pay

## 2021-05-29 ENCOUNTER — Encounter (HOSPITAL_COMMUNITY): Payer: Self-pay | Admitting: *Deleted

## 2021-05-29 ENCOUNTER — Emergency Department (HOSPITAL_COMMUNITY): Payer: Self-pay

## 2021-05-29 ENCOUNTER — Emergency Department (HOSPITAL_COMMUNITY)
Admission: EM | Admit: 2021-05-29 | Discharge: 2021-05-30 | Disposition: A | Discharge status: Home / Self Care | Payer: Self-pay | Attending: Emergency Medicine | Admitting: Emergency Medicine

## 2021-05-29 DIAGNOSIS — Z79899 Other long term (current) drug therapy: Secondary | ICD-10-CM | POA: Insufficient documentation

## 2021-05-29 DIAGNOSIS — R0600 Dyspnea, unspecified: Secondary | ICD-10-CM | POA: Insufficient documentation

## 2021-05-29 DIAGNOSIS — I10 Essential (primary) hypertension: Secondary | ICD-10-CM | POA: Insufficient documentation

## 2021-05-29 DIAGNOSIS — E669 Obesity, unspecified: Secondary | ICD-10-CM | POA: Insufficient documentation

## 2021-05-29 DIAGNOSIS — R0789 Other chest pain: Secondary | ICD-10-CM | POA: Insufficient documentation

## 2021-05-29 DIAGNOSIS — F419 Anxiety disorder, unspecified: Secondary | ICD-10-CM | POA: Insufficient documentation

## 2021-05-29 DIAGNOSIS — Z87891 Personal history of nicotine dependence: Secondary | ICD-10-CM | POA: Insufficient documentation

## 2021-05-29 DIAGNOSIS — Z8616 Personal history of COVID-19: Secondary | ICD-10-CM | POA: Insufficient documentation

## 2021-05-29 DIAGNOSIS — R569 Unspecified convulsions: Secondary | ICD-10-CM | POA: Insufficient documentation

## 2021-05-29 DIAGNOSIS — J45909 Unspecified asthma, uncomplicated: Secondary | ICD-10-CM | POA: Insufficient documentation

## 2021-05-29 LAB — COMPREHENSIVE METABOLIC PANEL
ALT: 45 U/L — ABNORMAL HIGH (ref 0–44)
AST: 36 U/L (ref 15–41)
Albumin: 4.3 g/dL (ref 3.5–5.0)
Alkaline Phosphatase: 56 U/L (ref 38–126)
Anion gap: 11 (ref 5–15)
BUN: 10 mg/dL (ref 6–20)
CO2: 22 mmol/L (ref 22–32)
Calcium: 9.8 mg/dL (ref 8.9–10.3)
Chloride: 101 mmol/L (ref 98–111)
Creatinine, Ser: 1.17 mg/dL (ref 0.61–1.24)
GFR, Estimated: >60 mL/min (ref >60)
Glucose, Bld: 96 mg/dL (ref 70–99)
Potassium: 3.8 mmol/L (ref 3.5–5.1)
Sodium: 134 mmol/L — ABNORMAL LOW (ref 135–145)
Total Bilirubin: 0.7 mg/dL (ref 0.3–1.2)
Total Protein: 7.7 g/dL (ref 6.5–8.1)

## 2021-05-29 LAB — CBC WITH DIFFERENTIAL/PLATELET
Abs Immature Granulocytes: 0.27 10*3/uL — ABNORMAL HIGH (ref 0.00–0.07)
Basophils Absolute: 0.1 10*3/uL (ref 0.0–0.1)
Basophils Relative: 1 %
Eosinophils Absolute: 0.2 10*3/uL (ref 0.0–0.5)
Eosinophils Relative: 1 %
HCT: 41.5 % (ref 39.0–52.0)
Hemoglobin: 14.3 g/dL (ref 13.0–17.0)
Immature Granulocytes: 2 %
Lymphocytes Relative: 12 %
Lymphs Abs: 1.8 10*3/uL (ref 0.7–4.0)
MCH: 31.5 pg (ref 26.0–34.0)
MCHC: 34.5 g/dL (ref 30.0–36.0)
MCV: 91.4 fL (ref 80.0–100.0)
Monocytes Absolute: 1.2 10*3/uL — ABNORMAL HIGH (ref 0.1–1.0)
Monocytes Relative: 8 %
Neutro Abs: 10.9 10*3/uL — ABNORMAL HIGH (ref 1.7–7.7)
Neutrophils Relative %: 76 %
Platelets: 323 10*3/uL (ref 150–400)
RBC: 4.54 MIL/uL (ref 4.22–5.81)
RDW: 13 % (ref 11.5–15.5)
WBC: 14.5 10*3/uL — ABNORMAL HIGH (ref 4.0–10.5)
nRBC: 0 % (ref 0.0–0.2)

## 2021-05-29 LAB — URINALYSIS, ROUTINE W REFLEX MICROSCOPIC
Bilirubin Urine: NEGATIVE
Glucose, UA: NEGATIVE mg/dL
Hgb urine dipstick: NEGATIVE
Ketones, ur: NEGATIVE mg/dL
Leukocytes,Ua: NEGATIVE
Nitrite: NEGATIVE
Protein, ur: 100 mg/dL — AB
Specific Gravity, Urine: 1.02 (ref 1.005–1.030)
pH: 6 (ref 5.0–8.0)

## 2021-05-29 LAB — TROPONIN I (HIGH SENSITIVITY): Troponin I (High Sensitivity): 4 ng/L (ref <18)

## 2021-05-29 LAB — CBG MONITORING, ED: Glucose-Capillary: 126 mg/dL — ABNORMAL HIGH (ref 70–99)

## 2021-05-29 LAB — RAPID URINE DRUG SCREEN, HOSP PERFORMED
Amphetamines: NOT DETECTED
Barbiturates: NOT DETECTED
Benzodiazepines: POSITIVE — AB
Cocaine: NOT DETECTED
Opiates: NOT DETECTED
Tetrahydrocannabinol: POSITIVE — AB

## 2021-05-29 LAB — ETHANOL: Alcohol, Ethyl (B): <10 mg/dL (ref <10)
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
## 2021-05-29 NOTE — ED Notes (Signed)
Pt states an increased in chest pressure and SOB. RN notified
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
## 2021-05-30 ENCOUNTER — Emergency Department (HOSPITAL_COMMUNITY): Payer: Self-pay

## 2021-05-30 LAB — D-DIMER, QUANTITATIVE: D-Dimer, Quant: 0.69 ug/mL-FEU — ABNORMAL HIGH (ref 0.00–0.50)

## 2021-05-30 MED ORDER — LORAZEPAM 1 MG PO TABS: 1.0000 mg | ORAL_TABLET | Freq: Three times a day (TID) | ORAL | 0 refills | Status: DC | PRN

## 2021-05-30 MED ORDER — LORAZEPAM 1 MG PO TABS
1.0000 mg | ORAL_TABLET | Freq: Once | ORAL | Status: AC
  Administered 2021-05-30: 1 mg via ORAL
  Filled 2021-05-30: qty 1

## 2021-05-30 MED ORDER — SODIUM CHLORIDE 0.9 % IV BOLUS
1000.0000 mL | Freq: Once | INTRAVENOUS | Status: AC
  Administered 2021-05-30: 1000 mL via INTRAVENOUS

## 2021-05-30 MED ORDER — ACETAMINOPHEN 325 MG PO TABS
650.0000 mg | ORAL_TABLET | Freq: Once | ORAL | Status: AC
  Administered 2021-05-30: 650 mg via ORAL
  Filled 2021-05-30: qty 2

## 2021-05-30 MED ORDER — ONDANSETRON HCL 4 MG/2ML IJ SOLN
4.0000 mg | Freq: Once | INTRAMUSCULAR | Status: AC
  Administered 2021-05-30: 4 mg via INTRAVENOUS
  Filled 2021-05-30: qty 2

## 2021-05-30 MED ORDER — LORAZEPAM 2 MG/ML IJ SOLN
1.0000 mg | Freq: Once | INTRAMUSCULAR | Status: AC
  Administered 2021-05-30: 1 mg via INTRAVENOUS

## 2021-05-30 MED ORDER — ONDANSETRON 4 MG PO TBDP
4.0000 mg | ORAL_TABLET | Freq: Once | ORAL | Status: AC
  Administered 2021-05-30: 4 mg via ORAL
  Filled 2021-05-30: qty 1

## 2021-05-30 MED ORDER — IOHEXOL 350 MG/ML SOLN
100.0000 mL | Freq: Once | INTRAVENOUS | Status: AC | PRN
  Administered 2021-05-30: 100 mL via INTRAVENOUS

## 2021-05-30 MED ORDER — LORAZEPAM 2 MG/ML IJ SOLN
1.0000 mg | Freq: Once | INTRAMUSCULAR | Status: AC
  Administered 2021-05-30: 1 mg via INTRAVENOUS
  Filled 2021-05-30: qty 1

## 2021-05-30 NOTE — ED Notes (Signed)
No second RN in traige, pt given 1 ativan, barcode unreadable. PA Mia at bedside

## 2021-05-30 NOTE — ED Provider Notes (Signed)
Patient was reevaluated by me in triage due to active vomiting in the waiting room.  I reviewed the patient's note and previous work-up thus far.  Patient is endorsing worsening anxiety.  His UDS was positive for benzodiazepines.  I checked the [place] controlled substance database and do not see any active prescriptions for benzodiazepines.  I do see that the patient had an admission in September 2021 for COVID-19 and it was noted to have a severe benzodiazepine dependence.  On reevaluation, the patient initially denies any recent benzodiazepine use.  However, he later states that he found Klonopin from a prescription when he was 23 and an old bottle and took several tablets approximately 3 days ago.  He denies any other recent benzodiazepine use.  Given his symptoms earlier today, I do question if there is a component of benzodiazepine withdrawal as I am uncertain if the patient is being forthright with his benzodiazepine history.  Since he is actively vomiting, will give Zofran and Ativan and continue to monitor the patient.  He is also endorsing worsening back pain since the episode that occurred at the party earlier today.  Since circumstances are unclear, will order thoracic and lumbar spine imaging.  Patient will require further work-up and evaluation in the emergency department.   Frederik Pear A, PA-C 05/30/21 0546    Gilda Crease, MD 05/31/21 [phone]

## 2021-05-30 NOTE — ED Provider Notes (Signed)
[facility] EMERGENCY DEPARTMENT Provider Note   CSN: [id number] Arrival date & time: 05/29/21  1841     History Chief Complaint  Patient presents with   Chest Pain   Anxiety    Scott Holland is a 30 y.o. male.  HPI Patient presents with his fiance provides much of the history.  The patient can answer questions, he is slightly slow to respond, and provides brief answers.  Patient has issues of asthma, anxiety, now presents after an episode consistent with seizure. He has no history of seizures.  He does have recent cessation of Klonopin and initiation of Flexeril for musculoskeletal injuries.  Yesterday, just prior to your outpatient an episode of what sounds to be seizure-like activity lasting about 10 minutes, with prolonged postictal phase.  Currently patient describes chest tightness.  Seemingly this chest tightness has been present for the past few days, with associated dyspnea.  Symptoms are worse with activity.  No history of cardiac disease. Patient has a notable history of COVID last month, and last year.  Following this most recent episode he received antivirals, therapy, was improving until this chest pain developed a few days ago.  No alcohol use, no other drug use.    Past Medical History:  Diagnosis Date   Anxiety    Asthma    Depression    Drug use    Hypertension     Patient Active Problem List   Diagnosis Date Noted   Chronic neck pain 05/19/2021   Right knee lateral meniscal tear 05/06/2021   Left wrist injury 05/06/2021   GERD (gastroesophageal reflux disease) 12/21/2020   Arthralgia of both hands 12/21/2020   Decreased libido 12/21/2020   Dental infection 06/23/2020   Depression, major, single episode, moderate (HCC) 06/04/2020   GAD (generalized anxiety disorder) 06/04/2020   Essential hypertension 06/04/2020   Severe benzodiazepine use disorder (HCC) 05/06/2020   COVID-19 virus detected 05/06/2020    Past Surgical History:   Procedure Laterality Date   FRACTURE SURGERY         Family History  Problem Relation Age of Onset   Hypertension Mother    Anxiety disorder Mother    Hypertension Maternal Grandmother    Anxiety disorder Maternal Grandmother    Hypertension Maternal Uncle    Anxiety disorder Maternal Uncle     Social History   Tobacco Use   Smoking status: Former    Packs/day: 1.00    Years: 3.00    Pack years: 3.00    Types: Cigarettes    Quit date: 08/22/2017    Years since quitting: 3.7   Smokeless tobacco: Never  Vaping Use   Vaping Use: Every day  Substance Use Topics   Alcohol use: Yes    Alcohol/week: 1.0 - 2.0 standard drink    Types: 1 - 2 Standard drinks or equivalent per week   Drug use: Yes    Frequency: 2.0 times per week    Types: Marijuana    Home Medications Prior to Admission medications   Medication Sig Start Date End Date Taking? Authorizing Provider  LORazepam (ATIVAN) 1 MG tablet Take 1 tablet (1 mg total) by mouth 3 (three) times daily as needed for anxiety. 05/30/21  Yes Gerhard Munch, MD  acetaminophen (TYLENOL) 500 MG tablet Take 1,000 mg by mouth every 8 (eight) hours as needed (pain).    [provider]  albuterol (VENTOLIN HFA) 108 (90 Base) MCG/ACT inhaler Inhale 2 puffs into the lungs every 4 (four)  hours as needed for wheezing or shortness of breath. 04/05/21   Sunnie Nielsen, DO  Ascorbic Acid (VITAMIN C PO) Take 1 tablet by mouth daily.    [provider]  buPROPion (WELLBUTRIN XL) 150 MG 24 hr tablet TAKE 1 TABLET BY MOUTH DAILY 01/07/21   Everrett Coombe, DO  busPIRone (BUSPAR) 15 MG tablet TAKE 1 TABLET BY MOUTH 3 TIMES DAILY 11/16/20   Everrett Coombe, DO  escitalopram (LEXAPRO) 10 MG tablet Take 1 tablet (10 mg total) by mouth daily. 04/05/21   Sunnie Nielsen, DO  fluticasone (FLOVENT HFA) 110 MCG/ACT inhaler Inhale 2 puffs into the lungs in the morning and at bedtime. Use immediately after salmeterol (Serevent) inhaler.  Rinse mouth after each use. Patient taking differently: Inhale 2 puffs into the lungs in the morning and at bedtime. Rinse mouth after each use. 07/29/20   Early, Sung Amabile, NP  guaiFENesin-codeine (ROBITUSSIN AC) 100-10 MG/5ML syrup Take 5-10 mLs by mouth 4 (four) times daily as needed for cough or congestion. 04/05/21   Sunnie Nielsen, DO  lisinopril (ZESTRIL) 20 MG tablet TAKE 1 TABLET BY MOUTH EVERY DAY 01/06/21   Everrett Coombe, DO  metoprolol tartrate (LOPRESSOR) 25 MG tablet Take 1 tablet (25 mg total) by mouth 2 (two) times daily. 10/16/20   Christen Butter, NP  montelukast (SINGULAIR) 10 MG tablet TAKE 1 TABLET BY MOUTH AT BEDTIME 12/02/20   Everrett Coombe, DO  Multiple Vitamin (MULTIVITAMIN WITH MINERALS) TABS tablet Take 1 tablet by mouth daily.    [provider]  naproxen (NAPROSYN) 500 MG tablet Take 1 tablet (500 mg total) by mouth 2 (two) times daily with a meal. 05/06/21 05/06/22  Monica Becton, MD  pantoprazole (PROTONIX) 40 MG tablet Take 1 tablet (40 mg total) by mouth 2 (two) times daily. 04/05/21   Sunnie Nielsen, DO  QUEtiapine (SEROQUEL) 100 MG tablet Take 1-2 tablets (100-200 mg total) by mouth at bedtime. 12/21/20   Everrett Coombe, DO  sildenafil (VIAGRA) 100 MG tablet Take 0.5-1 tablets (50-100 mg total) by mouth daily as needed for erectile dysfunction. 08/17/20   Sunnie Nielsen, DO  SUMAtriptan (IMITREX) 100 MG tablet TAKE 1 TABLET BY MOUTH DAILY AS NEEDED FOR MIGRAINE 02/08/21   Everrett Coombe, DO  traMADol (ULTRAM) 50 MG tablet Take 2 tablets (100 mg total) by mouth every 8 (eight) hours as needed for moderate pain. Maximum 6 tabs per day. 05/19/21   Monica Becton, MD    Allergies    Patient has no known allergies.  Review of Systems   Review of Systems  Constitutional:        Per HPI, otherwise negative  HENT:         Per HPI, otherwise negative  Respiratory:         Per HPI, otherwise negative  Cardiovascular:        Per HPI, otherwise  negative  Gastrointestinal:  Negative for vomiting.  Endocrine:       Negative aside from HPI  Genitourinary:        Neg aside from HPI   Musculoskeletal:        Per HPI, otherwise negative  Skin: Negative.   Neurological:  Positive for seizures. Negative for syncope.  Psychiatric/Behavioral:  The patient is nervous/anxious.    Physical Exam Updated Vital Signs BP (!) 153/91   Pulse 88   Temp 98.5 F (36.9 C) (Oral)   Resp 19   Ht 5\' 10"  (1.778 m)   Wt )  144.2 kg   SpO2 100%   BMI 45.61 kg/m   Physical Exam Vitals and nursing note reviewed.  Constitutional:      General: He is not in acute distress.    Appearance: He is well-developed. He is obese.  HENT:     Head: Normocephalic and atraumatic.  Eyes:     Conjunctiva/sclera: Conjunctivae normal.  Cardiovascular:     Rate and Rhythm: Normal rate and regular rhythm.  Pulmonary:     Effort: Pulmonary effort is normal. No respiratory distress.     Breath sounds: No stridor.  Abdominal:     General: There is no distension.  Skin:    General: Skin is warm and dry.  Neurological:     Mental Status: He is alert and oriented to person, place, and time.     Comments: Patient is oriented appropriately, though has a slight delay in speaking.  No atrophy, no focal deficits, moves all extremity spontaneously, no facial asymmetry.  No ongoing seizure activity.    ED Results / Procedures / Treatments   Labs (all labs ordered are listed, but only abnormal results are displayed) Labs Reviewed  CBC WITH DIFFERENTIAL/PLATELET - Abnormal; Notable for the following components:      Result Value   WBC 14.5 (*)    Neutro Abs 10.9 (*)    Monocytes Absolute 1.2 (*)    Abs Immature Granulocytes 0.27 (*)    All other components within normal limits  COMPREHENSIVE METABOLIC PANEL - Abnormal; Notable for the following components:   Sodium 134 (*)    ALT 45 (*)    All other components within normal limits  URINALYSIS, ROUTINE W  REFLEX MICROSCOPIC - Abnormal; Notable for the following components:   APPearance CLOUDY (*)    Protein, ur 100 (*)    Bacteria, UA MANY (*)    All other components within normal limits  RAPID URINE DRUG SCREEN, HOSP PERFORMED - Abnormal; Notable for the following components:   Benzodiazepines POSITIVE (*)    Tetrahydrocannabinol POSITIVE (*)    All other components within normal limits  D-DIMER, QUANTITATIVE - Abnormal; Notable for the following components:   D-Dimer, Quant 0.69 (*)    All other components within normal limits  CBG MONITORING, ED - Abnormal; Notable for the following components:   Glucose-Capillary 126 (*)    All other components within normal limits  ETHANOL  TROPONIN I (HIGH SENSITIVITY)  TROPONIN I (HIGH SENSITIVITY)    EKG EKG Interpretation  Date/Time:  Saturday May 29 2021 18:52:18 EDT Ventricular Rate:  104 PR Interval:  124 QRS Duration: 84 QT Interval:  336 QTC Calculation: 441 R Axis:   22 Text Interpretation: Sinus tachycardia Otherwise normal ECG Confirmed by Gerhard Munch [phone]) on 05/30/2021 10:01:50 AM  Radiology DG Chest 2 View  Result Date: 05/30/2021 CLINICAL DATA:  30 year old male with history of chest pain and shortness of breath. Evaluate for pneumonia. EXAM: CHEST - 2 VIEW COMPARISON:  Chest x-ray 07/28/2020. FINDINGS: Lung volumes are normal. No consolidative airspace disease. No pleural effusions. No pneumothorax. No pulmonary nodule or mass noted. Pulmonary vasculature and the cardiomediastinal silhouette are within normal limits. IMPRESSION: No radiographic evidence of acute cardiopulmonary disease. Electronically Signed   By: Trudie Reed M.D.   On: 05/30/2021 10:52   DG Thoracic Spine 2 View  Result Date: 05/30/2021 CLINICAL DATA:  30 year old male with history of trauma from a fall. Back pain. EXAM: THORACIC SPINE 2 VIEWS COMPARISON:  No priors.  PA  and lateral chest radiograph 07/28/2020. FINDINGS: There is no evidence  of acute thoracic spine fracture. Chronic appearing compression deformities of thoracic vertebral bodies at T10, T11 and T12, similar to prior chest radiograph. Exaggerated kyphosis in the lower lumbar spine which appears chronic based on comparison with prior lateral chest radiograph 07/28/2020. Multilevel disc space narrowing and discogenic sclerosis of the adjacent vertebral body endplates, most evident throughout the lower thoracic spine at T10-T11, T11-T12 and T12-L1. No other significant bone abnormalities are identified. IMPRESSION: 1. No acute radiographic abnormality of the thoracic spine. 2. 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 Electronically Signed   By: Trudie Reed M.D.   On: 05/30/2021 06:02   DG Lumbar Spine Complete  Result Date: 05/30/2021 CLINICAL DATA:  30 year old male with history of trauma from a fall complaining of back pain. EXAM: LUMBAR SPINE - COMPLETE 4+ VIEW COMPARISON:  No priors.  PA and lateral chest radiograph 07/28/2020. FINDINGS: Five views of the lumbar spine demonstrate no acute displaced fractures or definite acute compression type fractures. No defects of the pars interarticularis. Chronic appearing compression fracture of L2, similar to prior chest radiograph, with 10% loss of anterior vertebral body height. Mild multifocal disc space narrowing, most evident at L5-S1. Mild facet arthropathy, also most evident at L5-S1. IMPRESSION: 1. No acute radiographic abnormality of the lumbar spine. 2. Old compression fracture of L2, as above. 3. Mild multilevel degenerative disc disease and facet arthropathy, as above. Electronically Signed   By: Trudie Reed M.D.   On: 05/30/2021 06:04   CT HEAD WO CONTRAST ( )  Result Date: 05/29/2021 CLINICAL DATA:  Seizure, syncope, chest pain EXAM: CT HEAD WITHOUT CONTRAST TECHNIQUE: Contiguous axial images were obtained from the base  of the skull through the vertex without intravenous contrast. COMPARISON:  None. FINDINGS: Brain: No acute infarct or hemorrhage. Lateral ventricles and midline structures are unremarkable. No acute extra-axial fluid collections. No mass effect. Vascular: No hyperdense vessel or unexpected calcification. Skull: Normal. Negative for fracture or focal lesion. Sinuses/Orbits: No acute finding. Other: None. IMPRESSION: 1. No acute intracranial process. Electronically Signed   By: Sharlet Salina M.D.   On: 05/29/2021 19:43   CT Angio Chest PE W/Cm &/Or Wo Cm  Result Date: 05/30/2021 CLINICAL DATA:  Positive D-dimer. EXAM: CT ANGIOGRAPHY CHEST WITH CONTRAST TECHNIQUE: Multidetector CT imaging of the chest was performed using the standard protocol during bolus administration of intravenous contrast. Multiplanar CT image reconstructions and MIPs were obtained to evaluate the vascular anatomy. CONTRAST:  OMNIPAQUE IOHEXOL 350 MG/ML SOLN COMPARISON:  None. FINDINGS: Cardiovascular: Satisfactory opacification of the pulmonary arteries to the segmental level. No evidence of pulmonary embolism. Normal heart size. No pericardial effusion. Mediastinum/Nodes: No enlarged mediastinal, hilar, or axillary lymph nodes. Thyroid gland, trachea, and esophagus demonstrate no significant findings. Lungs/Pleura: 3 mm right upper lobe pulmonary nodule. Lungs are clear. No pleural effusion or pneumothorax. Upper Abdomen: No acute abnormality. Diffuse low attenuation of the liver as can be seen with hepatic steatosis. Musculoskeletal: No chest wall abnormality. No acute or significant osseous findings. Review of the MIP images confirms the above findings. IMPRESSION: 1. No evidence of pulmonary embolus. 2. No acute cardiopulmonary disease. 3. Hepatic steatosis. 4. A 3 mm right upper lobe pulmonary nodule. No follow-up needed if patient is low-risk. Non-contrast chest CT can be considered in 12 months if patient is high-risk. This  recommendation follows the consensus statement: Guidelines for Management  of Incidental Pulmonary Nodules Detected on CT Images: From the Fleischner Society 2017; Radiology 2017; [id number]. Electronically Signed   By: Elige Ko M.D.   On: 05/30/2021 14:15   CT Cervical Spine Wo Contrast  Result Date: 05/29/2021 CLINICAL DATA:  Chest pain, syncope, seizure EXAM: CT CERVICAL SPINE WITHOUT CONTRAST TECHNIQUE: Multidetector CT imaging of the cervical spine was performed without intravenous contrast. Multiplanar CT image reconstructions were also generated. COMPARISON:  None. FINDINGS: Alignment: Alignment is anatomic. Skull base and vertebrae: No acute fracture. No primary bone lesion or focal pathologic process. Soft tissues and spinal canal: No prevertebral fluid or swelling. No visible canal hematoma. Disc levels:  No significant spondylosis or facet hypertrophy. Upper chest: Central airway is patent.  Lung apices are clear. Other: Reconstructed images demonstrate no additional findings. IMPRESSION: 1. No acute cervical spine fracture. Electronically Signed   By: Sharlet Salina M.D.   On: 05/29/2021 19:44    Procedures Procedures   Medications Ordered in ED Medications  ondansetron (ZOFRAN) injection 4 mg (has no administration in time range)  ondansetron (ZOFRAN-ODT) disintegrating tablet 4 mg (4 mg Oral Given 05/30/21 0507)  LORazepam (ATIVAN) tablet 1 mg (1 mg Oral Given 05/30/21 0515)  LORazepam (ATIVAN) injection 1 mg (1 mg Intravenous Given 05/30/21 1034)  sodium chloride 0.9 % bolus 1,000 mL (0 mLs Intravenous Stopped 05/30/21 1209)  acetaminophen (TYLENOL) tablet 650 mg (650 mg Oral Given 05/30/21 1248)  iohexol (OMNIPAQUE) 350 MG/ML injection 100 mL (100 mLs Intravenous Contrast Given 05/30/21 1300)  LORazepam (ATIVAN) injection 1 mg (1 mg Intravenous Given 05/30/21 1445)    ED Course  I have reviewed the triage vital signs and the nursing notes.  Pertinent labs & imaging results that  were available during my care of the patient were reviewed by me and considered in my medical decision making (see chart for details).  Update: Patient in similar condition D-dimer, CT pending  4:13 PM Patient in no distress, awake, alert, speaking clearly.  He has mild hypertension, but has not been able to take today's antihypertensives.  He has had no additional seizure activity, he and I had a lengthy conversation with his fiance present about all findings including negative CT angiography, absent evidence for PE, ACS, pneumonia, pneumothorax, chest pain may be secondary to recent COVID infection versus other musculoskeletal etiology.  With new seizure, considerations including cessation of Klonopin versus initiation of Flexeril contributing to lowering of seizure threshold.  Patient will follow-up with neurology or primary care in this regard.  Given his ongoing use of multiple behavior off meds he will also follow-up with a behavioral practitioner as possible.  After hours of monitoring, no decompensation, no additional seizure activity, generally reassuring evaluation as above, patient discharged in stable condition.  Final Clinical Impression(s) / ED Diagnoses Final diagnoses:  Atypical chest pain  Seizure (HCC)    Rx / DC Orders ED Discharge Orders          Ordered    LORazepam (ATIVAN) 1 MG tablet  3 times daily PRN        05/30/21 1557             Gerhard Munch, MD 05/30/21 1614

## 2021-05-30 NOTE — Discharge Instructions (Addendum)
As discussed, with your new seizure activities importantly follow-up with your primary care physician and/or our neurology colleagues.  In addition, please use the provided list of behavioral health resources to arrange follow-up for ongoing consideration of your psychiatric medications.  Do not drive until you have been seen and evaluated by either your physician or our neurology colleagues.  Do not hesitate to return here for any changes in your condition.

## 2021-05-30 NOTE — ED Notes (Signed)
Patient verbalizes understanding of discharge instructions. Prescriptions and follow-up care reviewed. Opportunity for questioning and answers were provided. Armband removed by staff, pt discharged from ED ambulatory.  

## 2021-05-31 ENCOUNTER — Telehealth: Payer: Self-pay

## 2021-05-31 ENCOUNTER — Ambulatory Visit: Payer: Medicaid Other | Admitting: Sports Medicine

## 2021-05-31 NOTE — Telephone Encounter (Signed)
Transition Care Management Follow-up Telephone Call Date of discharge and from where: 05/30/2021 from [facility] How have you been since you were released from the hospital? Pt stated that he is feeling better and has follow up with PCP tomorrow 06/01/2021. Any questions or concerns? No  Items Reviewed: Did the pt receive and understand the discharge instructions provided? Yes  Medications obtained and verified? Yes  Other? No  Any new allergies since your discharge? No  Dietary orders reviewed? No Do you have support at home? Yes   Functional Questionnaire: (I = Independent and D = Dependent) ADLs: I  Bathing/Dressing- I  Meal Prep- I  Eating- I  Maintaining continence- I  Transferring/Ambulation- I  Managing Meds- I   Follow up appointments reviewed:  PCP Hospital f/u appt confirmed? Yes  Scheduled to see Dr. Everrett Coombe on 06/01/2021 @ 2:30pm. Specialist Hospital f/u appt confirmed? No   Are transportation arrangements needed? No  If their condition worsens, is the pt aware to call PCP or go to the Emergency Dept.? Yes Was the patient provided with contact information for the PCP's office or ED? Yes Was to pt encouraged to call back with questions or concerns? Yes

## 2021-06-01 ENCOUNTER — Ambulatory Visit (INDEPENDENT_AMBULATORY_CARE_PROVIDER_SITE_OTHER): Payer: Self-pay

## 2021-06-01 ENCOUNTER — Encounter: Payer: Self-pay | Admitting: Family Medicine

## 2021-06-01 ENCOUNTER — Other Ambulatory Visit: Payer: Self-pay

## 2021-06-01 ENCOUNTER — Ambulatory Visit (INDEPENDENT_AMBULATORY_CARE_PROVIDER_SITE_OTHER): Payer: Self-pay | Admitting: Family Medicine

## 2021-06-01 DIAGNOSIS — R569 Unspecified convulsions: Secondary | ICD-10-CM | POA: Insufficient documentation

## 2021-06-01 DIAGNOSIS — F321 Major depressive disorder, single episode, moderate: Secondary | ICD-10-CM

## 2021-06-01 DIAGNOSIS — F410 Panic disorder [episodic paroxysmal anxiety] without agoraphobia: Secondary | ICD-10-CM

## 2021-06-01 DIAGNOSIS — M25512 Pain in left shoulder: Secondary | ICD-10-CM

## 2021-06-01 DIAGNOSIS — F411 Generalized anxiety disorder: Secondary | ICD-10-CM

## 2021-06-01 MED ORDER — LORAZEPAM 1 MG PO TABS: 0.5000 mg | ORAL_TABLET | Freq: Every day | ORAL | 0 refills | Status: DC

## 2021-06-01 NOTE — Assessment & Plan Note (Signed)
He continues to have significant anxiety.  We will  wean him from bupropion due to this possibly decreasing seizure threshold and potentially worsening his anxiety.  I discussed with him that I would provide a short-term prescription for lorazepam until he is able to establish with psychiatry for continued medication management.  His girlfriend reports that she will monitor this medication closely and keep this locked up and distribute once daily for him.

## 2021-06-01 NOTE — Progress Notes (Signed)
Scott Holland - 30 y.o. male MRN [id number]  Date of birth: [birth year]  Subjective Chief Complaint  Patient presents with   Hospitalization Follow-up    HPI Scott Holland is a 30 year old male here today for follow-up of recent ER visit.  He was seen in the ED for possible seizure.  Reports that he had an episode over the weekend where he lost consciousness.  His girlfriend noted that extremities were stiff and he appeared to have convulsions of his head and neck.  He does not recall any of this.  This lasted for a few minutes.  Once he came to she states he was very confused.  EMS was contacted and transported him to the hospital.  In the ER he appeared to be postictal.  UDS positive for THC and benzodiazepines.  He does admit that he did take clonazepam once earlier in the week which was provided to him by his mother due to worsening anxiety.  It was felt that seizure may be related to benzodiazepine withdrawal along with use of other medications that may lower seizure threshold including Flexeril, tramadol and bupropion.  He denies any further seizure-like activity since discharge from the hospital.  He has not had any other neurological deficits or changes.  He continues to have significant anxiety.  He has tried several medications without improvement.  He did see psychiatry who recommended that he increase his BuSpar however he has found this to be ineffective.  He would like referral to a new psychiatrist.  ER did provide him with a supply of lorazepam.  He is asking to continue this due to anxiety and panic until he is able to establish with psychiatry.  ROS:  A comprehensive ROS was completed and negative except as noted per HPI  No Known Allergies  Past Medical History:  Diagnosis Date   Anxiety    Asthma    Depression    Drug use    Hypertension     Past Surgical History:  Procedure Laterality Date   FRACTURE SURGERY      Social History   Socioeconomic History   Marital status:  Significant Other    Spouse name: Not on file   Number of children: Not on file   Years of education: Not on file   Highest education level: Not on file  Occupational History   Occupation: Server  Tobacco Use   Smoking status: Former    Packs/day: 1.00    Years: 3.00    Pack years: 3.00    Types: Cigarettes    Quit date: 08/22/2017    Years since quitting: 3.7   Smokeless tobacco: Never  Vaping Use   Vaping Use: Every day  Substance and Sexual Activity   Alcohol use: Yes    Alcohol/week: 1.0 - 2.0 standard drink    Types: 1 - 2 Standard drinks or equivalent per week   Drug use: Yes    Frequency: 2.0 times per week    Types: Marijuana   Sexual activity: Yes    Partners: Female    Birth control/protection: None  Other Topics Concern   Not on file  Social History Narrative   Not on file   Social Determinants of Health   Financial Resource Strain: Not on file  Food Insecurity: Not on file  Transportation Needs: Not on file  Physical Activity: Not on file  Stress: Not on file  Social Connections: Not on file    Family History  Problem Relation Age of Onset  Hypertension Mother    Anxiety disorder Mother    Hypertension Maternal Grandmother    Anxiety disorder Maternal Grandmother    Hypertension Maternal Uncle    Anxiety disorder Maternal Uncle     Health Maintenance  Topic Date Due   COVID-19 Vaccine (1) Never done   INFLUENZA VACCINE  Never done   TETANUS/TDAP  07/28/2021 (Originally 04/03/2010)   Hepatitis C Screening  07/28/2021 (Originally 04/03/2009)   HIV Screening  07/28/2021 (Originally 04/03/2006)   HPV VACCINES  Aged Out     ----------------------------------------------------------------------------------------------------------------------------------------------------------------------------------------------------------------- Physical Exam BP (!) (P) 148/80 (BP Location: Left Arm, Patient Position: Sitting, Cuff Size: Large)   Pulse (!) (P)  103   Temp (P) 97.7 F (36.5 C)   Wt (P) 292 lb (132.5 kg)   SpO2 (P) 100%   BMI (P) 41.90 kg/m   Physical Exam Constitutional:      Appearance: Normal appearance.  Eyes:     General: No scleral icterus. Cardiovascular:     Rate and Rhythm: Normal rate and regular rhythm.  Musculoskeletal:     Cervical back: Neck supple.  Neurological:     General: No focal deficit present.     Mental Status: He is alert.  Psychiatric:     Comments: Tearful, nervous and anxious appearing.    ------------------------------------------------------------------------------------------------------------------------------------------------------------------------------------------------------------------- Assessment and Plan  Generalized anxiety disorder with panic attacks He continues to have significant anxiety.  We will  wean him from bupropion due to this possibly decreasing seizure threshold and potentially worsening his anxiety.  I discussed with him that I would provide a short-term prescription for lorazepam until he is able to establish with psychiatry for continued medication management.  His girlfriend reports that she will monitor this medication closely and keep this locked up and distribute once daily for him.  Seizure-like activity (HCC) Noted to have seizure activity likely related to benzodiazepine withdrawal in addition to medication which lowers seizure threshold.  He has discontinued tramadol and Flexeril.  We will wean him from bupropion.  I do not think he needs follow-up with neurology at this time given that there seems to be provoking cause.   Meds ordered this encounter  Medications   DISCONTD: LORazepam (ATIVAN) 1 MG tablet    Sig: Take 0.5-1 tablets (0.5-1 mg total) by mouth at bedtime.    Dispense:  30 tablet    Refill:  0   LORazepam (ATIVAN) 1 MG tablet    Sig: Take 0.5-1 tablets (0.5-1 mg total) by mouth at bedtime.    Dispense:  30 tablet    Refill:  0    No  follow-ups on file.    This visit occurred during the SARS-CoV-2 public health emergency.  Safety protocols were in place, including screening questions prior to the visit, additional usage of staff PPE, and extensive cleaning of exam room while observing appropriate contact time as indicated for disinfecting solutions.

## 2021-06-01 NOTE — Patient Instructions (Signed)
Discontinue tramadol and flexeril.  Take bupropion every other day for the next 10 days then stop.  Referral entered to psychiatry

## 2021-06-01 NOTE — Assessment & Plan Note (Signed)
Noted to have seizure activity likely related to benzodiazepine withdrawal in addition to medication which lowers seizure threshold.  He has discontinued tramadol and Flexeril.  We will wean him from bupropion.  I do not think he needs follow-up with neurology at this time given that there seems to be provoking cause.

## 2021-06-14 ENCOUNTER — Other Ambulatory Visit: Payer: Self-pay | Admitting: Family Medicine

## 2021-06-14 DIAGNOSIS — R059 Cough, unspecified: Secondary | ICD-10-CM

## 2021-06-14 DIAGNOSIS — R0609 Other forms of dyspnea: Secondary | ICD-10-CM

## 2021-06-22 ENCOUNTER — Other Ambulatory Visit: Payer: Self-pay | Admitting: Family Medicine

## 2021-07-06 ENCOUNTER — Encounter: Payer: Self-pay | Admitting: Family Medicine

## 2021-07-06 ENCOUNTER — Other Ambulatory Visit: Payer: Self-pay | Admitting: Family Medicine

## 2021-07-07 ENCOUNTER — Other Ambulatory Visit: Payer: Self-pay | Admitting: Family Medicine

## 2021-07-07 MED ORDER — LORAZEPAM 1 MG PO TABS: 0.5000 mg | ORAL_TABLET | Freq: Every day | ORAL | 0 refills | Status: DC

## 2021-07-07 NOTE — Telephone Encounter (Signed)
New rx sent

## 2021-07-08 ENCOUNTER — Other Ambulatory Visit: Payer: Self-pay | Admitting: Family Medicine

## 2021-07-08 MED ORDER — LORAZEPAM 1 MG PO TABS: 0.5000 mg | ORAL_TABLET | Freq: Every day | ORAL | 0 refills | Status: DC

## 2021-07-08 NOTE — Telephone Encounter (Signed)
Rx sent 

## 2021-07-08 NOTE — Telephone Encounter (Signed)
Called [facility], spoke with [place], and cancelled RXs for lorazepam.  Please resend RX to [facility] in [place].  Tiajuana Amass, CMA

## 2021-09-07 ENCOUNTER — Ambulatory Visit (INDEPENDENT_AMBULATORY_CARE_PROVIDER_SITE_OTHER): Payer: Self-pay

## 2021-09-07 ENCOUNTER — Ambulatory Visit (INDEPENDENT_AMBULATORY_CARE_PROVIDER_SITE_OTHER): Payer: Self-pay | Admitting: Sports Medicine

## 2021-09-07 ENCOUNTER — Other Ambulatory Visit: Payer: Self-pay

## 2021-09-07 DIAGNOSIS — M5412 Radiculopathy, cervical region: Secondary | ICD-10-CM

## 2021-09-07 DIAGNOSIS — M542 Cervicalgia: Secondary | ICD-10-CM

## 2021-09-07 MED ORDER — CYCLOBENZAPRINE HCL 10 MG PO TABS: ORAL_TABLET | ORAL | 0 refills | Status: DC

## 2021-09-07 MED ORDER — GABAPENTIN 300 MG PO CAPS
ORAL_CAPSULE | ORAL | 3 refills | Status: DC
Start: 2021-09-07 — End: 2021-12-24

## 2021-09-07 MED ORDER — PREDNISONE 50 MG PO TABS: ORAL_TABLET | ORAL | 0 refills | Status: DC

## 2021-09-07 NOTE — Addendum Note (Signed)
Addended by: Monica Becton on: 09/07/2021 03:51 PM   Modules accepted: Orders

## 2021-09-07 NOTE — Assessment & Plan Note (Addendum)
Scott Holland is a pleasant 31 year old male, he works as a Production assistant, radio, he also has a Public affairs consultant, unfortunately he has had increasing pain in his neck, radiation into the right periscapular region with radiation down the right arm to all fingers, also with progressive weakness with loss of grip. Due to the progressive weakness we will proceed sooner to MRI, we will get x-rays, cervical spine MRI, 5 days of prednisone, this is keeping him up at night so we are adding Neurontin, Flexeril. Home physical therapy given, return to see me for MRI results.

## 2021-09-07 NOTE — Progress Notes (Addendum)
° ° °  Procedures performed today:    None.  Independent interpretation of notes and tests performed by another provider:   None.  Brief History, Exam, Impression, and Recommendations:    Right cervical radiculopathy Scott Holland is a pleasant 31 year old male, he works as a Programme researcher, broadcasting/film/video, he also has a Clinical research associate, unfortunately he has had increasing pain in his neck, radiation into the right periscapular region with radiation down the right arm to all fingers, also with progressive weakness with loss of grip. Due to the progressive weakness we will proceed sooner to MRI, we will get x-rays, cervical spine MRI, 5 days of prednisone, this is keeping him up at night so we are adding Neurontin, Flexeril. Home physical therapy given, return to see me for MRI results.    ___________________________________________ Gwen Her. Dianah Field, M.D., ABFM., CAQSM. Primary Care and [place] Instructor of [employer] [facility] [employer]

## 2021-09-11 ENCOUNTER — Other Ambulatory Visit: Payer: Self-pay

## 2021-09-20 ENCOUNTER — Other Ambulatory Visit: Payer: Self-pay | Admitting: Family Medicine

## 2021-09-20 DIAGNOSIS — G43109 Migraine with aura, not intractable, without status migrainosus: Secondary | ICD-10-CM

## 2021-10-25 ENCOUNTER — Other Ambulatory Visit: Payer: Self-pay | Admitting: Family Medicine

## 2021-12-16 ENCOUNTER — Other Ambulatory Visit: Payer: Self-pay | Admitting: Medical-Surgical

## 2021-12-16 ENCOUNTER — Other Ambulatory Visit: Payer: Self-pay | Admitting: Family Medicine

## 2021-12-16 NOTE — Telephone Encounter (Signed)
Recommend psych refill this if he is seeing them.

## 2021-12-17 NOTE — Telephone Encounter (Signed)
LVM requesting pt callback for questions concerning refill. Medication should be refilled thru Psych. ?

## 2021-12-20 ENCOUNTER — Telehealth: Payer: Self-pay

## 2021-12-20 ENCOUNTER — Other Ambulatory Visit: Payer: Self-pay

## 2021-12-20 ENCOUNTER — Other Ambulatory Visit: Payer: Self-pay | Admitting: Family Medicine

## 2021-12-20 DIAGNOSIS — S8991XD Unspecified injury of right lower leg, subsequent encounter: Secondary | ICD-10-CM

## 2021-12-20 DIAGNOSIS — S8991XA Unspecified injury of right lower leg, initial encounter: Secondary | ICD-10-CM

## 2021-12-20 MED ORDER — QUETIAPINE FUMARATE 100 MG PO TABS
100.0000 mg | ORAL_TABLET | Freq: Every day | ORAL | 0 refills | Status: DC
Start: 2021-12-20 — End: 2022-01-19

## 2021-12-20 NOTE — Progress Notes (Unsigned)
Pt requesting tramadol for knee pain until appt with Dr. Karie Schwalbe on Friday. Per pt, he was instructed to call for a refill and hasn't used the medication since last year. Also, requesting Seroquel refill.  ? ?Pt has been advised only 30 day refill will be given. Appt needed with Dr. Ashley Royalty for refills.  ? ?Appt with Dr. Karie Schwalbe on Friday for continued knee pain and lt ankle swelling.  ? ?Front Desk: Please contact the patient to schedule Dr. Ashley Royalty appt and confirm Dr. Melvia Heaps appt. Thanks ?

## 2021-12-20 NOTE — Telephone Encounter (Signed)
See previous refill and phone message  ?

## 2021-12-20 NOTE — Progress Notes (Signed)
Seroquel sent

## 2021-12-20 NOTE — Progress Notes (Signed)
Patient has been scheduled with Dr Ashley Royalty for 01/19/22 for f/u & refills on meds. ? ?Also, patient was wondering if you had a chance to speak with Dr T about refilling his tramadol up until his appt date on Friday? Confirmed appt with Patient. ?

## 2021-12-24 ENCOUNTER — Ambulatory Visit (INDEPENDENT_AMBULATORY_CARE_PROVIDER_SITE_OTHER): Payer: Self-pay

## 2021-12-24 ENCOUNTER — Ambulatory Visit (INDEPENDENT_AMBULATORY_CARE_PROVIDER_SITE_OTHER): Payer: Self-pay | Admitting: Sports Medicine

## 2021-12-24 DIAGNOSIS — M25572 Pain in left ankle and joints of left foot: Secondary | ICD-10-CM

## 2021-12-24 DIAGNOSIS — G8929 Other chronic pain: Secondary | ICD-10-CM

## 2021-12-24 DIAGNOSIS — S8991XS Unspecified injury of right lower leg, sequela: Secondary | ICD-10-CM

## 2021-12-24 MED ORDER — TRAMADOL HCL 50 MG PO TABS
50.0000 mg | ORAL_TABLET | Freq: Three times a day (TID) | ORAL | 0 refills | Status: DC | PRN
Start: 2021-12-24 — End: 2022-06-18

## 2021-12-24 MED ORDER — IBUPROFEN 200 MG PO TABS: 800.0000 mg | ORAL_TABLET | Freq: Four times a day (QID) | ORAL | Status: DC | PRN

## 2021-12-24 NOTE — Assessment & Plan Note (Signed)
Several months of pain, mostly over the lateral tibiotalar joint, unclear etiology, not sure if he fell or not, but he is on multiple psychotropic agents. ?Pain is localized, ankle is stable but swollen. ?Adding x-rays, ASO, home physical therapy, ibuprofen 800 3 times daily and tramadol, return to see me in 6 weeks, MRI if not better for suspicion of OCD. ?

## 2021-12-24 NOTE — Assessment & Plan Note (Signed)
Scott Holland is a pleasant 31 year old male, approximately 7 to 8 months ago we diagnosed a lateral meniscal tear, he was injected and had about a month of relief, returns today with persistent pain. ?He understands that he will need arthroscopic intervention, his significant other sees Dr. Prentiss Bells with [facility] and they desire a referral to the same person. ?

## 2021-12-24 NOTE — Progress Notes (Signed)
? ? ?  Procedures performed today:   ? ?None. ? ?Independent interpretation of notes and tests performed by another provider:  ? ?None. ? ?Brief History, Exam, Impression, and Recommendations:   ? ?Right knee lateral meniscal tear ?[place] is a pleasant 31 year old male, approximately 7 to 8 months ago we diagnosed a lateral meniscal tear, he was injected and had about a month of relief, returns today with persistent pain. ?He understands that he will need arthroscopic intervention, his significant other sees Dr. Prentiss Bells with [facility] and they desire a referral to the same person. ? ?Left ankle pain ?Several months of pain, mostly over the lateral tibiotalar joint, unclear etiology, not sure if he fell or not, but he is on multiple psychotropic agents. ?Pain is localized, ankle is stable but swollen. ?Adding x-rays, ASO, home physical therapy, ibuprofen 800 3 times daily and tramadol, return to see me in 6 weeks, MRI if not better for suspicion of OCD. ? ?Chronic process with exacerbation and pharmacologic intervention ? ?___________________________________________ ?Ihor Austin. Benjamin Stain, M.D., ABFM., CAQSM. ?Primary Care and Sports Medicine ?[facility] ? ?Adjunct Instructor of Family Medicine  ?[employer] ?

## 2022-01-15 ENCOUNTER — Other Ambulatory Visit: Payer: Self-pay | Admitting: Family Medicine

## 2022-01-18 NOTE — Telephone Encounter (Signed)
Hold medication refill until 01/19/22 appt.

## 2022-01-19 ENCOUNTER — Ambulatory Visit: Payer: Self-pay | Admitting: Family Medicine

## 2022-01-19 ENCOUNTER — Other Ambulatory Visit: Payer: Self-pay | Admitting: Family Medicine

## 2022-01-31 ENCOUNTER — Other Ambulatory Visit: Payer: Self-pay | Admitting: Family Medicine

## 2022-02-08 ENCOUNTER — Ambulatory Visit: Payer: Self-pay | Admitting: Sports Medicine

## 2022-02-08 ENCOUNTER — Ambulatory Visit: Payer: Medicaid Other | Admitting: Sports Medicine

## 2022-02-14 ENCOUNTER — Other Ambulatory Visit: Payer: Self-pay | Admitting: Family Medicine

## 2022-03-15 ENCOUNTER — Other Ambulatory Visit: Payer: Self-pay | Admitting: Family Medicine

## 2022-03-16 ENCOUNTER — Other Ambulatory Visit: Payer: Self-pay

## 2022-03-16 DIAGNOSIS — F5105 Insomnia due to other mental disorder: Secondary | ICD-10-CM

## 2022-03-16 MED ORDER — QUETIAPINE FUMARATE 100 MG PO TABS: 100.0000 mg | ORAL_TABLET | Freq: Every day | ORAL | 0 refills | Status: DC

## 2022-03-18 ENCOUNTER — Other Ambulatory Visit: Payer: Self-pay | Admitting: Family Medicine

## 2022-03-18 DIAGNOSIS — G43109 Migraine with aura, not intractable, without status migrainosus: Secondary | ICD-10-CM

## 2022-03-18 NOTE — Telephone Encounter (Signed)
Pls contact pt to reschedule "No Show" appt from June 2023. Unable to provide refills. Thanks

## 2022-03-18 NOTE — Telephone Encounter (Signed)
Patient has been scheduled for 03/22/22. AMUCK

## 2022-03-22 ENCOUNTER — Ambulatory Visit: Payer: Self-pay | Admitting: Family Medicine

## 2022-04-14 ENCOUNTER — Ambulatory Visit: Payer: Medicaid Other | Admitting: Family Medicine

## 2022-04-15 ENCOUNTER — Other Ambulatory Visit: Payer: Self-pay | Admitting: Family Medicine

## 2022-04-15 DIAGNOSIS — F5105 Insomnia due to other mental disorder: Secondary | ICD-10-CM

## 2022-04-27 ENCOUNTER — Other Ambulatory Visit: Payer: Self-pay | Admitting: Family Medicine

## 2022-04-28 NOTE — Telephone Encounter (Signed)
Pls contact patient and advise unable to provide med refill. Canceled and missed appts. Reschedule. Thanks

## 2022-06-10 ENCOUNTER — Emergency Department (HOSPITAL_BASED_OUTPATIENT_CLINIC_OR_DEPARTMENT_OTHER): Payer: Medicaid Other

## 2022-06-10 ENCOUNTER — Encounter (HOSPITAL_BASED_OUTPATIENT_CLINIC_OR_DEPARTMENT_OTHER): Payer: Self-pay | Admitting: Urology

## 2022-06-10 ENCOUNTER — Other Ambulatory Visit: Payer: Self-pay

## 2022-06-10 ENCOUNTER — Emergency Department (HOSPITAL_BASED_OUTPATIENT_CLINIC_OR_DEPARTMENT_OTHER)
Admission: EM | Admit: 2022-06-10 | Discharge: 2022-06-11 | Disposition: A | Discharge status: Home / Self Care | Payer: Medicaid Other | Attending: Emergency Medicine | Admitting: Emergency Medicine

## 2022-06-10 DIAGNOSIS — Z7951 Long term (current) use of inhaled steroids: Secondary | ICD-10-CM | POA: Insufficient documentation

## 2022-06-10 DIAGNOSIS — Z79899 Other long term (current) drug therapy: Secondary | ICD-10-CM | POA: Insufficient documentation

## 2022-06-10 DIAGNOSIS — K529 Noninfective gastroenteritis and colitis, unspecified: Secondary | ICD-10-CM

## 2022-06-10 DIAGNOSIS — R000 Tachycardia, unspecified: Secondary | ICD-10-CM | POA: Insufficient documentation

## 2022-06-10 DIAGNOSIS — J45909 Unspecified asthma, uncomplicated: Secondary | ICD-10-CM | POA: Insufficient documentation

## 2022-06-10 DIAGNOSIS — I10 Essential (primary) hypertension: Secondary | ICD-10-CM | POA: Insufficient documentation

## 2022-06-10 LAB — URINALYSIS, ROUTINE W REFLEX MICROSCOPIC
Bilirubin Urine: NEGATIVE
Glucose, UA: NEGATIVE mg/dL
Hgb urine dipstick: NEGATIVE
Ketones, ur: NEGATIVE mg/dL
Leukocytes,Ua: NEGATIVE
Nitrite: NEGATIVE
Protein, ur: NEGATIVE mg/dL
Specific Gravity, Urine: 1.02 (ref 1.005–1.030)
pH: 7 (ref 5.0–8.0)

## 2022-06-10 LAB — CBC
HCT: 40.7 % (ref 39.0–52.0)
Hemoglobin: 13.6 g/dL (ref 13.0–17.0)
MCH: 30.6 pg (ref 26.0–34.0)
MCHC: 33.4 g/dL (ref 30.0–36.0)
MCV: 91.5 fL (ref 80.0–100.0)
Platelets: 296 10*3/uL (ref 150–400)
RBC: 4.45 MIL/uL (ref 4.22–5.81)
RDW: 13 % (ref 11.5–15.5)
WBC: 10.2 10*3/uL (ref 4.0–10.5)
nRBC: 0 % (ref 0.0–0.2)

## 2022-06-10 LAB — COMPREHENSIVE METABOLIC PANEL
ALT: 24 U/L (ref 0–44)
AST: 20 U/L (ref 15–41)
Albumin: 4.2 g/dL (ref 3.5–5.0)
Alkaline Phosphatase: 52 U/L (ref 38–126)
Anion gap: 6 (ref 5–15)
BUN: 8 mg/dL (ref 6–20)
CO2: 30 mmol/L (ref 22–32)
Calcium: 9.2 mg/dL (ref 8.9–10.3)
Chloride: 102 mmol/L (ref 98–111)
Creatinine, Ser: 0.83 mg/dL (ref 0.61–1.24)
GFR, Estimated: >60 mL/min (ref >60)
Glucose, Bld: 96 mg/dL (ref 70–99)
Potassium: 3.8 mmol/L (ref 3.5–5.1)
Sodium: 138 mmol/L (ref 135–145)
Total Bilirubin: 0.5 mg/dL (ref 0.3–1.2)
Total Protein: 7.5 g/dL (ref 6.5–8.1)

## 2022-06-10 LAB — LIPASE, BLOOD: Lipase: 26 U/L (ref 11–51)

## 2022-06-10 MED ORDER — LACTATED RINGERS IV BOLUS
1000.0000 mL | Freq: Once | INTRAVENOUS | Status: AC
  Administered 2022-06-10: 1000 mL via INTRAVENOUS

## 2022-06-10 MED ORDER — KETOROLAC TROMETHAMINE 15 MG/ML IJ SOLN
15.0000 mg | Freq: Once | INTRAMUSCULAR | Status: AC
  Administered 2022-06-10: 15 mg via INTRAVENOUS
  Filled 2022-06-10: qty 1

## 2022-06-10 MED ORDER — ONDANSETRON HCL 4 MG/2ML IJ SOLN
4.0000 mg | Freq: Once | INTRAMUSCULAR | Status: AC
  Administered 2022-06-10: 4 mg via INTRAVENOUS
  Filled 2022-06-10: qty 2

## 2022-06-10 MED ORDER — DICYCLOMINE HCL 20 MG PO TABS: 20.0000 mg | ORAL_TABLET | Freq: Two times a day (BID) | ORAL | 0 refills | Status: DC

## 2022-06-10 MED ORDER — IOHEXOL 300 MG/ML  SOLN
100.0000 mL | Freq: Once | INTRAMUSCULAR | Status: AC | PRN
  Administered 2022-06-10: 100 mL via INTRAVENOUS

## 2022-06-10 MED ORDER — METOCLOPRAMIDE HCL 5 MG/ML IJ SOLN
10.0000 mg | Freq: Once | INTRAMUSCULAR | Status: AC
  Administered 2022-06-10: 10 mg via INTRAVENOUS
  Filled 2022-06-10: qty 2

## 2022-06-10 MED ORDER — MORPHINE SULFATE (PF) 4 MG/ML IV SOLN
4.0000 mg | Freq: Once | INTRAVENOUS | Status: AC
  Administered 2022-06-10: 4 mg via INTRAVENOUS
  Filled 2022-06-10: qty 1

## 2022-06-10 MED ORDER — ONDANSETRON 4 MG PO TBDP: 4.0000 mg | ORAL_TABLET | Freq: Three times a day (TID) | ORAL | 0 refills | Status: DC | PRN

## 2022-06-10 NOTE — ED Triage Notes (Signed)
Pt states generalized abdominal pain x 2 weeks, states N/V/D x 4 today  States increase in stress and anxiety Denies Fever

## 2022-06-10 NOTE — Discharge Instructions (Signed)
Your work-up today was reassuring including your blood work, and CT scan of the abdomen.  I have sent Bentyl and Zofran into the pharmacy for you.  You can also take Tylenol and ibuprofen as needed for pain.  Ensure you are drinking plenty of fluids.  For any concerning symptoms return to the emergency room otherwise follow-up with your primary care provider.

## 2022-06-10 NOTE — ED Provider Notes (Signed)
[facility] EMERGENCY DEPARTMENT Provider Note   CSN: [id number] Arrival date & time: 06/10/22  1914     History  Chief Complaint  Patient presents with   Abdominal Pain    Scott Holland is a 31 y.o. male.  31 year old male presents today for evaluation of abdominal pain.  He states he always has baseline GI issues including reflux, and some nausea however no pain notes this bad.  This pain started today with associated vomiting and diarrhea.  He denies fever.  He has some flank pain however no hematuria, or blood in the stool.  As tolerated p.o. intake around noon states it was minimal.  Mom is at bedside.  The history is provided by the patient. No language interpreter was used.       Home Medications Prior to Admission medications   Medication Sig Start Date End Date Taking? Authorizing Provider  acetaminophen (TYLENOL) 500 MG tablet Take 1,000 mg by mouth every 8 (eight) hours as needed (pain).    [provider]  albuterol (VENTOLIN HFA) 108 (90 Base) MCG/ACT inhaler Inhale 2 puffs into the lungs every 4 (four) hours as needed for wheezing or shortness of breath. 04/05/21   Sunnie Nielsen, DO  Ascorbic Acid (VITAMIN C PO) Take 1 tablet by mouth daily.    [provider]  clonazePAM (KLONOPIN) 1 MG tablet Take 1 mg by mouth 3 (three) times daily as needed for anxiety.    [provider]  escitalopram (LEXAPRO) 10 MG tablet Take 1 tablet (10 mg total) by mouth daily. 04/05/21   Sunnie Nielsen, DO  fluticasone (FLOVENT HFA) 110 MCG/ACT inhaler Inhale 2 puffs into the lungs in the morning and at bedtime. Use immediately after salmeterol (Serevent) inhaler. Rinse mouth after each use. Patient taking differently: Inhale 2 puffs into the lungs in the morning and at bedtime. Rinse mouth after each use. 07/29/20   Tollie Eth, NP  ibuprofen (ADVIL) 200 MG tablet Take 4 tablets (800 mg total) by mouth every 6 (six) hours as needed. 12/24/21    Monica Becton, MD  lisinopril (ZESTRIL) 20 MG tablet TAKE 1 TABLET BY MOUTH DAILY FOR FOR BLOOD PRESSURE 10/26/21   Everrett Coombe, DO  metoprolol tartrate (LOPRESSOR) 25 MG tablet Take 1 tablet (25 mg total) by mouth 2 (two) times daily. NO REFILLS. OVERDUE FOR AN APPT. 12/17/21   Everrett Coombe, DO  montelukast (SINGULAIR) 10 MG tablet TAKE 1 TABLET BY MOUTH AT BEDTIME 06/14/21   Everrett Coombe, DO  Multiple Vitamin (MULTIVITAMIN WITH MINERALS) TABS tablet Take 1 tablet by mouth daily.    [provider]  pantoprazole (PROTONIX) 40 MG tablet TAKE ONE TABLET BY MOUTH TWICE A DAY 01/31/22   Everrett Coombe, DO  pregabalin (LYRICA) 25 MG capsule Take 25 mg by mouth 3 (three) times daily.    [provider]  QUEtiapine (SEROQUEL) 100 MG tablet Take 1-2 tablets (100-200 mg total) by mouth at bedtime. 03/16/22   Everrett Coombe, DO  sildenafil (VIAGRA) 100 MG tablet Take 0.5-1 tablets (50-100 mg total) by mouth daily as needed for erectile dysfunction. 08/17/20   Sunnie Nielsen, DO  SUMAtriptan (IMITREX) 100 MG tablet TAKE ONE TABLET BY MOUTH DAILY AS NEEDED FOR MIGRAINE 09/20/21   Everrett Coombe, DO  traMADol (ULTRAM) 50 MG tablet Take 1 tablet (50 mg total) by mouth every 8 (eight) hours as needed for moderate pain. 12/24/21   Monica Becton, MD      Allergies  Patient has no known allergies.    Review of Systems   Review of Systems  Constitutional:  Negative for chills and fever.  Gastrointestinal:  Positive for abdominal pain, diarrhea and vomiting.  Genitourinary:  Positive for flank pain. Negative for dysuria.  Neurological:  Negative for light-headedness.  All other systems reviewed and are negative.   Physical Exam Updated Vital Signs BP (!) 150/89 (BP Location: Right Arm)   Pulse 93   Temp 98.3 F (36.8 C) (Oral)   Resp 18   Ht 6\' 2"  (1.88 m)   Wt 122.5 kg   SpO2 100%   BMI 34.67 kg/m  Physical Exam Vitals and nursing note reviewed.   Constitutional:      General: He is not in acute distress.    Appearance: Normal appearance. He is not ill-appearing.  HENT:     Head: Normocephalic and atraumatic.     Nose: Nose normal.  Eyes:     General: No scleral icterus.    Extraocular Movements: Extraocular movements intact.     Conjunctiva/sclera: Conjunctivae normal.  Cardiovascular:     Rate and Rhythm: Regular rhythm. Tachycardia present.     Pulses: Normal pulses.  Pulmonary:     Effort: Pulmonary effort is normal. No respiratory distress.     Breath sounds: Normal breath sounds. No wheezing or rales.  Abdominal:     General: There is no distension.     Palpations: Abdomen is soft.     Tenderness: There is abdominal tenderness. There is right CVA tenderness and left CVA tenderness. There is no guarding or rebound.  Musculoskeletal:        General: Normal range of motion.     Cervical back: Normal range of motion.     Right lower leg: No edema.     Left lower leg: No edema.  Skin:    General: Skin is warm and dry.  Neurological:     General: No focal deficit present.     Mental Status: He is alert. Mental status is at baseline.     ED Results / Procedures / Treatments   Labs (all labs ordered are listed, but only abnormal results are displayed) Labs Reviewed  LIPASE, BLOOD  COMPREHENSIVE METABOLIC PANEL  CBC  URINALYSIS, ROUTINE W REFLEX MICROSCOPIC    EKG None  Radiology No results found.  Procedures Procedures    Medications Ordered in ED Medications  lactated ringers bolus 1,000 mL (has no administration in time range)  morphine (PF) 4 MG/ML injection 4 mg (has no administration in time range)  ondansetron (ZOFRAN) injection 4 mg (has no administration in time range)    ED Course/ Medical Decision Making/ A&P                           Medical Decision Making Amount and/or Complexity of Data Reviewed Labs: ordered. Radiology: ordered.  Risk Prescription drug management.   Medical  Decision Making / ED Course   This patient presents to the ED for concern of abdominal pain, this involves an extensive number of treatment options, and is a complaint that carries with it a high risk of complications and morbidity.  The differential diagnosis includes gastroenteritis, colitis, pancreatitis, appendicitis, viral syndrome  MDM: 31 year old male with no significant past medical history presents today for above-mentioned complaint.  Work-up so far overall benign including CBC, CMP which were both unremarkable.  Lipase within normal limits.  UA without evidence of UTI.  Given the amount of discomfort he is in, and associated with vomiting and diarrhea we will provide IV fluids, pain medication, and obtain CT imaging.  Will reevaluate.  Patient without acute abdomen on exam.  CT abdomen pelvis with contrast without any acute intra-abdominal finding.  Patient likely has gastroenteritis.  Symptomatic management discussed.  Patient states he still has some pain and discomfort.  States that he will not be able to get completely pain-free while here in the emergency room.  Discussed expected course.  We will provide dose of Toradol, and Reglan.  Will discharge with Bentyl, and Zofran.  He voices understanding and is in agreement with this plan.    Lab Tests: -I ordered, reviewed, and interpreted labs.   The pertinent results include:   Labs Reviewed  LIPASE, BLOOD  COMPREHENSIVE METABOLIC PANEL  CBC  URINALYSIS, ROUTINE W REFLEX MICROSCOPIC      EKG  EKG Interpretation  Date/Time:    Ventricular Rate:    PR Interval:    QRS Duration:   QT Interval:    QTC Calculation:   R Axis:     Text Interpretation:           Imaging Studies ordered: I ordered imaging studies including CT abdomen pelvis with contrast I independently visualized and interpreted imaging. I agree with the radiologist interpretation   Medicines ordered and prescription drug management: Meds ordered  this encounter  Medications   lactated ringers bolus 1,000 mL   morphine (PF) 4 MG/ML injection 4 mg   ondansetron (ZOFRAN) injection 4 mg    -I have reviewed the patients home medicines and have made adjustments as needed  Reevaluation: After the interventions noted above, I reevaluated the patient and found that they have :improved  Co morbidities that complicate the patient evaluation  Past Medical History:  Diagnosis Date   Anxiety    Asthma    Depression    Drug use    Hypertension       Dispostion: Patient is stable for discharge.  Discharged in stable condition.  Return precautions discussed.  Symptomatic management discussed.  No indication for additional work-up or management.  Final Clinical Impression(s) / ED Diagnoses Final diagnoses:  Gastroenteritis    Rx / DC Orders ED Discharge Orders          Ordered    ondansetron (ZOFRAN-ODT) 4 MG disintegrating tablet  Every 8 hours PRN        06/10/22 2354    dicyclomine (BENTYL) 20 MG tablet  2 times daily        06/10/22 2354              Marita [place], PA-C 06/10/22 2354    Glynn Octave, MD 06/11/22 Rich Fuchs

## 2022-06-13 ENCOUNTER — Telehealth: Payer: Self-pay | Admitting: General Practice

## 2022-06-13 NOTE — Telephone Encounter (Signed)
Transition Care Management Unsuccessful Follow-up Telephone Call  Date of discharge and from where:  06/11/22 from [facility] med center  Attempts:  1st Attempt  Reason for unsuccessful TCM follow-up call:  Left voice message

## 2022-06-15 NOTE — Telephone Encounter (Signed)
Transition Care Management Unsuccessful Follow-up Telephone Call  Date of discharge and from where:  06/11/22 from [facility]  Attempts:  2nd Attempt  Reason for unsuccessful TCM follow-up call:  Left voice message

## 2022-06-17 ENCOUNTER — Other Ambulatory Visit: Payer: Self-pay

## 2022-06-17 ENCOUNTER — Emergency Department (HOSPITAL_COMMUNITY): Payer: Medicaid Other

## 2022-06-17 ENCOUNTER — Encounter (HOSPITAL_COMMUNITY): Payer: Self-pay | Admitting: *Deleted

## 2022-06-17 ENCOUNTER — Observation Stay (HOSPITAL_COMMUNITY)
Admission: EM | Admit: 2022-06-17 | Discharge: 2022-06-22 | Disposition: A | Discharge status: Home / Self Care | Payer: Medicaid Other | Attending: Internal Medicine | Admitting: Internal Medicine

## 2022-06-17 DIAGNOSIS — J45909 Unspecified asthma, uncomplicated: Secondary | ICD-10-CM | POA: Insufficient documentation

## 2022-06-17 DIAGNOSIS — F411 Generalized anxiety disorder: Secondary | ICD-10-CM | POA: Diagnosis present

## 2022-06-17 DIAGNOSIS — F410 Panic disorder [episodic paroxysmal anxiety] without agoraphobia: Secondary | ICD-10-CM | POA: Diagnosis present

## 2022-06-17 DIAGNOSIS — F13939 Sedative, hypnotic or anxiolytic use, unspecified with withdrawal, unspecified: Secondary | ICD-10-CM

## 2022-06-17 DIAGNOSIS — Z87891 Personal history of nicotine dependence: Secondary | ICD-10-CM | POA: Insufficient documentation

## 2022-06-17 DIAGNOSIS — K219 Gastro-esophageal reflux disease without esophagitis: Secondary | ICD-10-CM | POA: Insufficient documentation

## 2022-06-17 DIAGNOSIS — I10 Essential (primary) hypertension: Secondary | ICD-10-CM | POA: Insufficient documentation

## 2022-06-17 DIAGNOSIS — K089 Disorder of teeth and supporting structures, unspecified: Secondary | ICD-10-CM | POA: Insufficient documentation

## 2022-06-17 DIAGNOSIS — F321 Major depressive disorder, single episode, moderate: Secondary | ICD-10-CM | POA: Diagnosis present

## 2022-06-17 DIAGNOSIS — Z79899 Other long term (current) drug therapy: Secondary | ICD-10-CM | POA: Insufficient documentation

## 2022-06-17 DIAGNOSIS — F13239 Sedative, hypnotic or anxiolytic dependence with withdrawal, unspecified: Principal | ICD-10-CM | POA: Insufficient documentation

## 2022-06-17 DIAGNOSIS — F1320 Sedative, hypnotic or anxiolytic dependence, uncomplicated: Secondary | ICD-10-CM

## 2022-06-17 LAB — RAPID URINE DRUG SCREEN, HOSP PERFORMED
Amphetamines: NOT DETECTED
Barbiturates: NOT DETECTED
Benzodiazepines: POSITIVE — AB
Cocaine: NOT DETECTED
Opiates: NOT DETECTED
Tetrahydrocannabinol: POSITIVE — AB

## 2022-06-17 LAB — CBC
HCT: 43.7 % (ref 39.0–52.0)
Hemoglobin: 15.1 g/dL (ref 13.0–17.0)
MCH: 31.5 pg (ref 26.0–34.0)
MCHC: 34.6 g/dL (ref 30.0–36.0)
MCV: 91.2 fL (ref 80.0–100.0)
Platelets: 337 10*3/uL (ref 150–400)
RBC: 4.79 MIL/uL (ref 4.22–5.81)
RDW: 12.8 % (ref 11.5–15.5)
WBC: 11.5 10*3/uL — ABNORMAL HIGH (ref 4.0–10.5)
nRBC: 0 % (ref 0.0–0.2)

## 2022-06-17 LAB — URINALYSIS, ROUTINE W REFLEX MICROSCOPIC
Bilirubin Urine: NEGATIVE
Glucose, UA: NEGATIVE mg/dL
Hgb urine dipstick: NEGATIVE
Ketones, ur: NEGATIVE mg/dL
Leukocytes,Ua: NEGATIVE
Nitrite: NEGATIVE
Protein, ur: NEGATIVE mg/dL
Specific Gravity, Urine: 1.02 (ref 1.005–1.030)
pH: 9 — ABNORMAL HIGH (ref 5.0–8.0)

## 2022-06-17 LAB — COMPREHENSIVE METABOLIC PANEL
ALT: 23 U/L (ref 0–44)
AST: 22 U/L (ref 15–41)
Albumin: 4.2 g/dL (ref 3.5–5.0)
Alkaline Phosphatase: 48 U/L (ref 38–126)
Anion gap: 14 (ref 5–15)
BUN: 8 mg/dL (ref 6–20)
CO2: 25 mmol/L (ref 22–32)
Calcium: 10.2 mg/dL (ref 8.9–10.3)
Chloride: 100 mmol/L (ref 98–111)
Creatinine, Ser: 0.96 mg/dL (ref 0.61–1.24)
GFR, Estimated: >60 mL/min (ref >60)
Glucose, Bld: 112 mg/dL — ABNORMAL HIGH (ref 70–99)
Potassium: 4.6 mmol/L (ref 3.5–5.1)
Sodium: 139 mmol/L (ref 135–145)
Total Bilirubin: 0.7 mg/dL (ref 0.3–1.2)
Total Protein: 7.6 g/dL (ref 6.5–8.1)

## 2022-06-17 LAB — ETHANOL: Alcohol, Ethyl (B): <10 mg/dL (ref <10)

## 2022-06-17 LAB — LIPASE, BLOOD: Lipase: 25 U/L (ref 11–51)

## 2022-06-17 MED ORDER — LORAZEPAM 1 MG PO TABS
2.0000 mg | ORAL_TABLET | Freq: Once | ORAL | Status: AC
  Administered 2022-06-17: 2 mg via ORAL
  Filled 2022-06-17: qty 2

## 2022-06-17 NOTE — Telephone Encounter (Signed)
Transition Care Management Unsuccessful Follow-up Telephone Call  Date of discharge and from where:  06/11/22 from [facility]  Attempts:  3rd Attempt  Reason for unsuccessful TCM follow-up call:  Left voice message

## 2022-06-17 NOTE — ED Provider Triage Note (Signed)
Emergency Medicine Provider Triage Evaluation Note  Rondle Lohse , a 31 y.o. male  was evaluated in triage.  Pt complains of abd pain and anxiety. States he's been off lyrica and klonopin for > 1 week.   Abd pain for 1 week. Endorses nausea and vomiting.   Review of Systems  Positive: Nausea, sweating, vomiting, abd pain, anxiety Negative: Fever   Physical Exam  There were no vitals taken for this visit. Gen:   Awake, no distress   Resp:  Normal effort  MSK:   Moves extremities without difficulty  Other: Somewhat tremulous, abd soft nttp. No guarding. Oral mucosa dry.   Medical Decision Making  Medically screening exam initiated at 7:24 PM.  Appropriate orders placed.  Josuha Meaders was informed that the remainder of the evaluation will be completed by another provider, this initial triage assessment does not replace that evaluation, and the importance of remaining in the ED until their evaluation is complete.  Multiple sx, some degree of benzo withdrawal likely.    Pati Gallo [place], [place] 06/17/22 1930

## 2022-06-17 NOTE — ED Triage Notes (Signed)
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

## 2022-06-18 DIAGNOSIS — F13939 Sedative, hypnotic or anxiolytic use, unspecified with withdrawal, unspecified: Secondary | ICD-10-CM | POA: Diagnosis present

## 2022-06-18 DIAGNOSIS — F13930 Sedative, hypnotic or anxiolytic use, unspecified with withdrawal, uncomplicated: Secondary | ICD-10-CM

## 2022-06-18 DIAGNOSIS — K089 Disorder of teeth and supporting structures, unspecified: Secondary | ICD-10-CM

## 2022-06-18 MED ORDER — CHLORDIAZEPOXIDE HCL 25 MG PO CAPS
25.0000 mg | ORAL_CAPSULE | Freq: Four times a day (QID) | ORAL | Status: DC
  Administered 2022-06-18 (×3): 25 mg via ORAL
  Filled 2022-06-18 (×3): qty 1

## 2022-06-18 MED ORDER — ALBUTEROL SULFATE HFA 108 (90 BASE) MCG/ACT IN AERS: 2.0000 | INHALATION_SPRAY | RESPIRATORY_TRACT | Status: DC | PRN

## 2022-06-18 MED ORDER — ONDANSETRON 4 MG PO TBDP: 4.0000 mg | ORAL_TABLET | Freq: Four times a day (QID) | ORAL | Status: DC | PRN

## 2022-06-18 MED ORDER — CHLORDIAZEPOXIDE HCL 25 MG PO CAPS: 25.0000 mg | ORAL_CAPSULE | Freq: Three times a day (TID) | ORAL | Status: DC

## 2022-06-18 MED ORDER — PREGABALIN 50 MG PO CAPS
50.0000 mg | ORAL_CAPSULE | Freq: Three times a day (TID) | ORAL | Status: DC
  Administered 2022-06-18 – 2022-06-19 (×3): 50 mg via ORAL
  Filled 2022-06-18 (×3): qty 2

## 2022-06-18 MED ORDER — CHLORDIAZEPOXIDE HCL 25 MG PO CAPS: 25.0000 mg | ORAL_CAPSULE | ORAL | Status: DC

## 2022-06-18 MED ORDER — PREGABALIN 25 MG PO CAPS: 75.0000 mg | ORAL_CAPSULE | Freq: Three times a day (TID) | ORAL | Status: DC

## 2022-06-18 MED ORDER — SODIUM CHLORIDE 0.9 % IV SOLN: 250.0000 mL | INTRAVENOUS | Status: DC | PRN

## 2022-06-18 MED ORDER — SODIUM CHLORIDE 0.9% FLUSH: 3.0000 mL | INTRAVENOUS | Status: DC | PRN

## 2022-06-18 MED ORDER — QUETIAPINE FUMARATE 100 MG PO TABS
100.0000 mg | ORAL_TABLET | Freq: Every day | ORAL | Status: DC
  Administered 2022-06-18: 100 mg via ORAL
  Filled 2022-06-18: qty 1

## 2022-06-18 MED ORDER — LORAZEPAM 1 MG PO TABS
2.0000 mg | ORAL_TABLET | Freq: Once | ORAL | Status: AC
  Administered 2022-06-18: 2 mg via ORAL
  Filled 2022-06-18: qty 2

## 2022-06-18 MED ORDER — HYDROXYZINE HCL 25 MG PO TABS
25.0000 mg | ORAL_TABLET | Freq: Four times a day (QID) | ORAL | Status: DC | PRN
  Administered 2022-06-19 (×2): 25 mg via ORAL
  Filled 2022-06-18 (×2): qty 1

## 2022-06-18 MED ORDER — LISINOPRIL 20 MG PO TABS
20.0000 mg | ORAL_TABLET | Freq: Every day | ORAL | Status: DC
  Administered 2022-06-18: 20 mg via ORAL
  Filled 2022-06-18: qty 1

## 2022-06-18 MED ORDER — LOPERAMIDE HCL 2 MG PO CAPS: 2.0000 mg | ORAL_CAPSULE | ORAL | Status: DC | PRN

## 2022-06-18 MED ORDER — PANTOPRAZOLE SODIUM 40 MG PO TBEC
40.0000 mg | DELAYED_RELEASE_TABLET | Freq: Two times a day (BID) | ORAL | Status: DC
  Administered 2022-06-18 – 2022-06-22 (×9): 40 mg via ORAL
  Filled 2022-06-18 (×9): qty 1

## 2022-06-18 MED ORDER — ACETAMINOPHEN 650 MG RE SUPP: 650.0000 mg | Freq: Four times a day (QID) | RECTAL | Status: DC | PRN

## 2022-06-18 MED ORDER — SODIUM CHLORIDE 0.9% FLUSH
3.0000 mL | Freq: Two times a day (BID) | INTRAVENOUS | Status: DC
  Administered 2022-06-18: 3 mL via INTRAVENOUS

## 2022-06-18 MED ORDER — ESCITALOPRAM OXALATE 10 MG PO TABS
20.0000 mg | ORAL_TABLET | Freq: Every day | ORAL | Status: DC
  Administered 2022-06-18 – 2022-06-22 (×5): 20 mg via ORAL
  Filled 2022-06-18 (×5): qty 2

## 2022-06-18 MED ORDER — CHLORDIAZEPOXIDE HCL 25 MG PO CAPS: 25.0000 mg | ORAL_CAPSULE | Freq: Every day | ORAL | Status: DC

## 2022-06-18 MED ORDER — ACETAMINOPHEN 325 MG PO TABS
650.0000 mg | ORAL_TABLET | Freq: Four times a day (QID) | ORAL | Status: DC | PRN
  Administered 2022-06-19: 650 mg via ORAL
  Filled 2022-06-18: qty 2

## 2022-06-18 MED ORDER — CHLORDIAZEPOXIDE HCL 25 MG PO CAPS
25.0000 mg | ORAL_CAPSULE | Freq: Four times a day (QID) | ORAL | Status: DC | PRN
  Administered 2022-06-19: 25 mg via ORAL
  Filled 2022-06-18: qty 1

## 2022-06-18 NOTE — Discharge Instructions (Signed)
Intensive Outpatient Programs  [facility]    [facility] [address]     [address] [place],  [place]     [place], [place] [phone]      [phone]  [facility]   [facility]  (Inpatient and outpatient)  [phone] (Suboxone and Methadone) [address]           [phone]           [facility]    [facility] - Intensive Outpatient [address]     [address] [place], [place] [address]     [place], [place]  [phone]      [phone]  [facility] (Outpatient, Inpatient, [facility] (Groups and Residental) (insurance only) [phone]    [place], [place]          [phone]       [facility]    [facility] (for caregivers and family) [address]     [address] [place], [place]     [place], [place] [phone]      [phone]  Residential Treatment Programs  [facility]  [place](2 years) Residential: 90 days)  [facility] ([facility]      [address] [place], [place]     [place], [place] [phone]      [phone] or [phone]  [facility]    [facility] [address]      [address] [place], [place]     [place], [place] [phone]      [phone]  [facility]   [facility] ([facility]) [address]     [address] [place], [place] [address]     [place], [place] [phone]      [phone] Admissions: 8am-3pm M-F  [facility]: Residential Program [phone] Days)              [facility]: [facility]  [place], [place]     [place], [place]  [phone] or [phone]    (Walk in Hours over the weekend or by referral)   [facility]: [facility]:[phone] (for crisis  response 24 hours a day)  Follow with Primary MD Everrett Coombe, DO in 7 days   Get CBC, CMP, Magnesium, TSH, A1c  -  checked next visit within 1 week by Primary MD   Activity: As tolerated with Full fall precautions use walker/cane & assistance as needed  Disposition Home    Diet: Heart Healthy    Special Instructions: If you have smoked or chewed Tobacco  in the last 2 yrs please stop smoking, stop any regular Alcohol  and or any Recreational drug use.  On your next visit with your primary care physician please Get Medicines reviewed and adjusted.  Please request your Prim.MD to go over all Hospital Tests and Procedure/Radiological results at the follow up, please get all Hospital records sent to your Prim MD by signing hospital release before you go home.  If you experience worsening of your admission symptoms, develop shortness of breath, life threatening emergency, suicidal or homicidal thoughts you must seek medical attention immediately by calling 911 or calling your MD immediately  if symptoms less  severe.  You Must read complete instructions/literature along with all the possible adverse reactions/side effects for all the Medicines you take and that have been prescribed to you. Take any new Medicines after you have completely understood and accpet all the possible adverse reactions/side effects.   Do not drive, operate heavy machinery, perform activities at heights, swimming or participation in water activities or provide baby sitting services if your were admitted for syncope or siezures until you have seen by Primary MD or a Neurologist and advised to do so again.  Do not drive when taking Pain medications.  Do not take more than prescribed Pain, Sleep and Anxiety Medications  Wear Seat belts while driving.   Please note  You were cared for by a hospitalist during your hospital stay. If you have any questions about your discharge medications or the care you received while  you were in the hospital after you are discharged, you can call the unit and asked to speak with the hospitalist on call if the hospitalist that took care of you is not available. Once you are discharged, your primary care physician will handle any further medical issues. Please note that NO REFILLS for any discharge medications will be authorized once you are discharged, as it is imperative that you return to your primary care physician (or establish a relationship with a primary care physician if you do not have one) for your aftercare needs so that they can reassess your need for medications and monitor your lab values.

## 2022-06-18 NOTE — Assessment & Plan Note (Addendum)
Continue lexapro, increase to 20mg  daily  Also on seroquel at night

## 2022-06-18 NOTE — Assessment & Plan Note (Addendum)
Increase lexapro to 20mg  daily -has tried prozac (made him crazy) and buspar and hydroxyzine -states the clonopin Tid works well for him, but he ran out of this. Feels like providers do not want him on this and he runs out. Was doing well on this regimen; however, as mentioned above he was never prescribed enough to take TID. Only given #20/month -start back lyrica TID at 50mg  from 100mg  TID since it has been a few weeks.  -will have psych consult for any additional input

## 2022-06-18 NOTE — Assessment & Plan Note (Addendum)
Continue protonix, has run out of medication

## 2022-06-18 NOTE — ED Notes (Signed)
Patient brought in for reassessment.  Patient requesting some meds for withdrawal.  Patient states that he was feeling anxious.

## 2022-06-18 NOTE — Assessment & Plan Note (Addendum)
Continue lisinopril. Has been out of medication.  Start back lisinopril only and adjust as needed

## 2022-06-18 NOTE — ED Provider Notes (Signed)
I was asked by nursing staff to reassess patient. Chronic Klonopin and Lyrica use. Out for approx 1 week. Came in for withdrawal.  Did well in [facility] with Ativan given by previous provider.  Unfortunately has been waiting for 11 hours.  He is nauseous, tremulous.  Denies SI, HI, AVH.  Will give additional dose of Ativan.   Scott Holland A, PA-C 06/18/22 0602    Palumbo, April, MD 06/18/22 [phone]

## 2022-06-18 NOTE — Assessment & Plan Note (Signed)
Poor dentition with broken teeth.  Has pain on left mandible, but no erythema/abscess or drainage Needs to see dentist, will see if SW can help with this.

## 2022-06-18 NOTE — Care Management (Signed)
Consult for [facility] for dental, PCP and SA resources. Patient is wanting to detox from benzodiazepines. Information for self presenting on AVS, as well as PCP and Dental services through health department.

## 2022-06-18 NOTE — H&P (Signed)
History and Physical    Patient: Scott Holland NID:[id number] DOB: 1990-11-30 DOA: 06/17/2022 DOS: the patient was seen and examined on 06/18/2022 PCP: Luetta Nutting, DO  Patient coming from: Home    Chief Complaint: withdrawal from xanax   HPI: [name] is a 31 y.o. male with medical history significant of anxiety and depression, asthma, HTN, who presented to ED with complaints of withdrawal symptoms from xanax. He was taking klonopin 1mg  TID, lyrica and lexapro for his anxiety, but ran out a few weeks ago because he could not afford to go back to the doctor. He began getting xanax and pain medication off the street and was taking anywhere from 6-10mg  of xanax/day. He stopped cold [place] this past Wednesday. Thursday  he started to have diaphoresis, nausea and vomiting, tremors and came to ED on Friday. He has been hospitalized for BZD withdrawal in 2021. He was doing well, but also had some stressors precipitate street use of the drug. He is interested in inpatient rehab.  He has been feeling good. Denies any fever/chills, vision changes/headaches, chest pain or palpitations, shortness of breath or cough, abdominal pain, N/V/D, dysuria or leg swelling.   From PMP website he was given 20 pills of 1mg  klonopin for 30 days. He was getting 90 pills of lyrica 100mg  to take TID.   He does not smoke or drink.   ER Course:  vitals: afebrile, bp: 156/107, HR: 132, RR: 18, oxygen: 98%RA Pertinent labs: WBC: 11.5, UDS: +BZD and THC CXR: normal  In ED: given a total of 6mg  ativan since 10/27 at 1930. [facility] asked to admit.    Review of Systems: As mentioned in the history of present illness. All other systems reviewed and are negative. Past Medical History:  Diagnosis Date   Anxiety    Asthma    Depression    Drug use    Hypertension    Past Surgical History:  Procedure Laterality Date   FRACTURE SURGERY     Social History:  reports that he quit smoking about 4 years ago. His smoking  use included cigarettes. He has a 3.00 pack-year smoking history. He has never used smokeless tobacco. He reports that he does not currently use alcohol after a past usage of about 1.0 - 2.0 standard drink of alcohol per week. He reports current drug use. Drug: Marijuana.  No Known Allergies  Family History  Problem Relation Age of Onset   Hypertension Mother    Anxiety disorder Mother    Hypertension Maternal Grandmother    Anxiety disorder Maternal Grandmother    Hypertension Maternal Uncle    Anxiety disorder Maternal Uncle     Prior to Admission medications   Medication Sig Start Date End Date Taking? Authorizing Provider  acetaminophen (TYLENOL) 500 MG tablet Take 1,000 mg by mouth every 8 (eight) hours as needed (pain).    [provider]  albuterol (VENTOLIN HFA) 108 (90 Base) MCG/ACT inhaler Inhale 2 puffs into the lungs every 4 (four) hours as needed for wheezing or shortness of breath. 04/05/21   Emeterio Reeve, DO  Ascorbic Acid (VITAMIN C PO) Take 1 tablet by mouth daily.    [provider]  clonazePAM (KLONOPIN) 1 MG tablet Take 1 mg by mouth 3 (three) times daily as needed for anxiety.    [provider]  dicyclomine (BENTYL) 20 MG tablet Take 1 tablet (20 mg total) by mouth 2 (two) times daily. 06/10/22   Deatra Canter, Amjad, PA-C  escitalopram (LEXAPRO) 10  MG tablet Take 1 tablet (10 mg total) by mouth daily. 04/05/21   Sunnie Nielsen, DO  fluticasone (FLOVENT HFA) 110 MCG/ACT inhaler Inhale 2 puffs into the lungs in the morning and at bedtime. Use immediately after salmeterol (Serevent) inhaler. Rinse mouth after each use. Patient taking differently: Inhale 2 puffs into the lungs in the morning and at bedtime. Rinse mouth after each use. 07/29/20   Tollie Eth, NP  ibuprofen (ADVIL) 200 MG tablet Take 4 tablets (800 mg total) by mouth every 6 (six) hours as needed. 12/24/21   Monica Becton, MD  lisinopril (ZESTRIL) 20 MG tablet TAKE 1 TABLET  BY MOUTH DAILY FOR FOR BLOOD PRESSURE 10/26/21   Everrett Coombe, DO  metoprolol tartrate (LOPRESSOR) 25 MG tablet Take 1 tablet (25 mg total) by mouth 2 (two) times daily. NO REFILLS. OVERDUE FOR AN APPT. 12/17/21   Everrett Coombe, DO  montelukast (SINGULAIR) 10 MG tablet TAKE 1 TABLET BY MOUTH AT BEDTIME 06/14/21   Everrett Coombe, DO  Multiple Vitamin (MULTIVITAMIN WITH MINERALS) TABS tablet Take 1 tablet by mouth daily.    [provider]  ondansetron (ZOFRAN-ODT) 4 MG disintegrating tablet Take 1 tablet (4 mg total) by mouth every 8 (eight) hours as needed. 06/10/22   Marita Kansas, PA-C  pantoprazole (PROTONIX) 40 MG tablet TAKE ONE TABLET BY MOUTH TWICE A DAY 01/31/22   Everrett Coombe, DO  pregabalin (LYRICA) 25 MG capsule Take 25 mg by mouth 3 (three) times daily.    [provider]  QUEtiapine (SEROQUEL) 100 MG tablet Take 1-2 tablets (100-200 mg total) by mouth at bedtime. 03/16/22   Everrett Coombe, DO  sildenafil (VIAGRA) 100 MG tablet Take 0.5-1 tablets (50-100 mg total) by mouth daily as needed for erectile dysfunction. 08/17/20   Sunnie Nielsen, DO  SUMAtriptan (IMITREX) 100 MG tablet TAKE ONE TABLET BY MOUTH DAILY AS NEEDED FOR MIGRAINE 09/20/21   Everrett Coombe, DO  traMADol (ULTRAM) 50 MG tablet Take 1 tablet (50 mg total) by mouth every 8 (eight) hours as needed for moderate pain. 12/24/21   Monica Becton, MD    Physical Exam: Vitals:   06/18/22 1056 06/18/22 1235 06/18/22 1237 06/18/22 1238  BP: (!) 134/96   (!) 154/103  Pulse: 95   95  Resp: 16   18  Temp:  97.7 F (36.5 C) 97.9 F (36.6 C)   TempSrc:  Oral Oral   SpO2: 99%   100%  Weight:      Height:       General:  Appears calm and comfortable and is in NAD Eyes:  PERRL, EOMI, normal lids, iris ENT:  grossly normal hearing, lips & tongue, mmm; poor dentition. No abscess/erythema/drainage.  Neck:  no LAD, masses or thyromegaly; no carotid bruits Cardiovascular:  RRR, no m/r/g. No LE edema.   Respiratory:   CTA bilaterally with no wheezes/rales/rhonchi.  Normal respiratory effort. Abdomen:  soft, NT, ND, NABS Back:   normal alignment, no CVAT Skin:  no rash or induration seen on limited exam Musculoskeletal:  grossly normal tone BUE/BLE, good ROM, no bony abnormality Lower extremity:  No LE edema.  Limited foot exam with no ulcerations.  2+ distal pulses. Psychiatric:  grossly normal mood and affect, speech fluent and appropriate, AOx3 Neurologic:  CN 2-12 grossly intact, moves all extremities in coordinated fashion, sensation intact   Radiological Exams on Admission: Independently reviewed - see discussion in A/P where applicable  DG Chest 2 View  Result Date: 06/17/2022  CLINICAL DATA:  Detox EXAM: CHEST - 2 VIEW COMPARISON:  05/30/2021 FINDINGS: Lungs are clear.  No pleural effusion or pneumothorax. The heart is normal in size. Lower thoracic kyphotic deformity. IMPRESSION: Normal chest radiographs. Electronically Signed   By: Julian Hy M.D.   On: 06/17/2022 21:02    EKG: Independently reviewed.  Sinus tachycardia with rate 126; nonspecific ST changes with no evidence of acute ischemia   Labs on Admission: I have personally reviewed the available labs and imaging studies at the time of the admission.  Pertinent labs:   WBC: 11.5,  UDS: +BZD and THC  Assessment and Plan: Principal Problem:   Benzodiazepine withdrawal (HCC) Active Problems:   Generalized anxiety disorder with panic attacks   Depression, major, single episode, moderate (HCC)   Poor dentition   Essential hypertension   GERD (gastroesophageal reflux disease)    Assessment and Plan: * Benzodiazepine withdrawal ([place]) 31 year old with history of BZD use for anxiety who ran out and was getting xanax and pain medication daily  on the street x 2-3 weeks who stopped cold [place] this past Wednesday and started to go into withdrawals so came to ED -obs to [facility]  -I think with possibility of  not having continuity of care a librium taper may be his best option. He is interested in coming off or going back on his klonopin regimen; however, he was never given enough klonopin per  Month to take TID as he is stating. Discussed with him for his health would try to avoid BZD at all costs -librium taper protocol ordered -psychiatry consult to help with anxiety, increased his lexapro to 20mg  and started back his lyrica at 50mg  TID (typically takes 100mg  TID)  -SW consult as he is interested in inpatient rehab; however, he has no insurance. Hopefully SW will have some other options.  -seizure precautions   Generalized anxiety disorder with panic attacks Increase lexapro to 20mg  daily -has tried prozac (made him crazy) and buspar and hydroxyzine -states the clonopin Tid works well for him, but he ran out of this. Feels like providers do not want him on this and he runs out. Was doing well on this regimen; however, as mentioned above he was never prescribed enough to take TID. Only given #20/month -start back lyrica TID at 50mg  from 100mg  TID since it has been a few weeks.  -will have psych consult for any additional input   Depression, major, single episode, moderate (HCC) Continue lexapro, increase to 20mg  daily  Also on seroquel at night   Poor dentition Poor dentition with broken teeth.  Has pain on left mandible, but no erythema/abscess or drainage Needs to see dentist, will see if SW can help with this.   Essential hypertension Continue lisinopril. Has been out of medication.  Start back lisinopril only and adjust as needed   GERD (gastroesophageal reflux disease) Continue protonix, has run out of medication     Advance Care Planning:   Code Status: Full Code   Consults: SW and psychiatry   DVT Prophylaxis: ambulation   Family Communication: mother and grandmother at bedside   Severity of Illness: The appropriate patient status for this patient is OBSERVATION.  Observation status is judged to be reasonable and necessary in order to provide the required intensity of service to ensure the patient's safety. The patient's presenting symptoms, physical exam findings, and initial radiographic and laboratory data in the context of their medical condition is felt to place them at decreased  risk for further clinical deterioration. Furthermore, it is anticipated that the patient will be medically stable for discharge from the hospital within 2 midnights of admission.   Author: Orma Flaming, MD 06/18/2022 3:10 PM  For on call review [email or web address].

## 2022-06-18 NOTE — ED Provider Notes (Signed)
[facility] EMERGENCY DEPARTMENT Provider Note   CSN: [id number] Arrival date & time: 06/17/22  1849     History  Chief Complaint  Patient presents with   Withdrawal    Scott Holland is a 31 y.o. male. With past medical history of hypertension, asthma, chronic clonazepam use who presents to the emergency department with withdrawal.  States he is currently in Xanax withdrawal. States that he was initial prescribed clonazepam, but has not had a prescription for this in almost 1 month. States that he has been buying Xanax off the street. States he is taking anywhere from 8-10mg  of Xanax daily. Last use was on Wednesday night. States he is currently withdrawing. States he feels very anxious, sweaty, nauseated. States he is having diarrhea. Additionally, he states he has been using 80-100mg  of percocet daily over the past 1 week. Does not take this consistently. He denies other drug or alcohol use. Does have a reported history of withdrawal seizure from Xanax.   HPI     Home Medications Prior to Admission medications   Medication Sig Start Date End Date Taking? Authorizing Provider  acetaminophen (TYLENOL) 500 MG tablet Take 1,000 mg by mouth every 8 (eight) hours as needed (pain).    [provider]  albuterol (VENTOLIN HFA) 108 (90 Base) MCG/ACT inhaler Inhale 2 puffs into the lungs every 4 (four) hours as needed for wheezing or shortness of breath. 04/05/21   Sunnie Nielsen, DO  Ascorbic Acid (VITAMIN C PO) Take 1 tablet by mouth daily.    [provider]  clonazePAM (KLONOPIN) 1 MG tablet Take 1 mg by mouth 3 (three) times daily as needed for anxiety.    [provider]  dicyclomine (BENTYL) 20 MG tablet Take 1 tablet (20 mg total) by mouth 2 (two) times daily. 06/10/22   Karie Mainland, Amjad, PA-C  escitalopram (LEXAPRO) 10 MG tablet Take 1 tablet (10 mg total) by mouth daily. 04/05/21   Sunnie Nielsen, DO  fluticasone (FLOVENT HFA) 110  MCG/ACT inhaler Inhale 2 puffs into the lungs in the morning and at bedtime. Use immediately after salmeterol (Serevent) inhaler. Rinse mouth after each use. Patient taking differently: Inhale 2 puffs into the lungs in the morning and at bedtime. Rinse mouth after each use. 07/29/20   Tollie Eth, NP  ibuprofen (ADVIL) 200 MG tablet Take 4 tablets (800 mg total) by mouth every 6 (six) hours as needed. 12/24/21   Monica Becton, MD  lisinopril (ZESTRIL) 20 MG tablet TAKE 1 TABLET BY MOUTH DAILY FOR FOR BLOOD PRESSURE 10/26/21   Everrett Coombe, DO  metoprolol tartrate (LOPRESSOR) 25 MG tablet Take 1 tablet (25 mg total) by mouth 2 (two) times daily. NO REFILLS. OVERDUE FOR AN APPT. 12/17/21   Everrett Coombe, DO  montelukast (SINGULAIR) 10 MG tablet TAKE 1 TABLET BY MOUTH AT BEDTIME 06/14/21   Everrett Coombe, DO  Multiple Vitamin (MULTIVITAMIN WITH MINERALS) TABS tablet Take 1 tablet by mouth daily.    [provider]  ondansetron (ZOFRAN-ODT) 4 MG disintegrating tablet Take 1 tablet (4 mg total) by mouth every 8 (eight) hours as needed. 06/10/22   Marita Kansas, PA-C  pantoprazole (PROTONIX) 40 MG tablet TAKE ONE TABLET BY MOUTH TWICE A DAY 01/31/22   Everrett Coombe, DO  pregabalin (LYRICA) 25 MG capsule Take 25 mg by mouth 3 (three) times daily.    [provider]  QUEtiapine (SEROQUEL) 100 MG tablet Take 1-2 tablets (100-200 mg total) by mouth at bedtime.  03/16/22   Luetta Nutting, DO  sildenafil (VIAGRA) 100 MG tablet Take 0.5-1 tablets (50-100 mg total) by mouth daily as needed for erectile dysfunction. 08/17/20   Emeterio Reeve, DO  SUMAtriptan (IMITREX) 100 MG tablet TAKE ONE TABLET BY MOUTH DAILY AS NEEDED FOR MIGRAINE 09/20/21   Luetta Nutting, DO  traMADol (ULTRAM) 50 MG tablet Take 1 tablet (50 mg total) by mouth every 8 (eight) hours as needed for moderate pain. 12/24/21   Silverio Decamp, MD      Allergies    Patient has no known allergies.    Review of Systems    Review of Systems  Constitutional:  Positive for appetite change, chills and diaphoresis.  Gastrointestinal:  Positive for abdominal pain, diarrhea and nausea.  Psychiatric/Behavioral:  The patient is nervous/anxious.   All other systems reviewed and are negative.   Physical Exam Updated Vital Signs BP (!) 154/103 (BP Location: Left Arm)   Pulse 95   Temp 97.9 F (36.6 C) (Oral)   Resp 18   Ht 6\' 2"  (1.88 m)   Wt 122.5 kg   SpO2 100%   BMI 34.67 kg/m  Physical Exam Vitals and nursing note reviewed.  Constitutional:      General: He is not in acute distress.    Appearance: Normal appearance. He is ill-appearing. He is not toxic-appearing.     Comments: Mildly diaphoretic   HENT:     Head: Normocephalic.     Mouth/Throat:     Mouth: Mucous membranes are dry.     Pharynx: Oropharynx is clear.  Eyes:     General: No scleral icterus.    Extraocular Movements: Extraocular movements intact.  Cardiovascular:     Rate and Rhythm: Normal rate and regular rhythm.     Pulses: Normal pulses.     Heart sounds: No murmur heard. Pulmonary:     Effort: Pulmonary effort is normal. No respiratory distress.     Breath sounds: Normal breath sounds.  Abdominal:     General: Abdomen is protuberant. Bowel sounds are increased. There is no distension.     Palpations: Abdomen is soft.  Musculoskeletal:        General: Normal range of motion.     Cervical back: Neck supple.  Skin:    General: Skin is warm and dry.     Capillary Refill: Capillary refill takes less than 2 seconds.  Neurological:     General: No focal deficit present.     Mental Status: He is alert and oriented to person, place, and time.     Motor: Tremor present.  Psychiatric:        Attention and Perception: Attention and perception normal.        Mood and Affect: Mood is anxious.        Speech: Speech normal.        Behavior: Behavior is cooperative.        Thought Content: Thought content normal.        Cognition  and Memory: Cognition and memory normal.        Judgment: Judgment is impulsive.     ED Results / Procedures / Treatments   Labs (all labs ordered are listed, but only abnormal results are displayed) Labs Reviewed  RAPID URINE DRUG SCREEN, HOSP PERFORMED - Abnormal; Notable for the following components:      Result Value   Benzodiazepines POSITIVE (*)    Tetrahydrocannabinol POSITIVE (*)    All other components within normal  limits  COMPREHENSIVE METABOLIC PANEL - Abnormal; Notable for the following components:   Glucose, Bld 112 (*)    All other components within normal limits  CBC - Abnormal; Notable for the following components:   WBC 11.5 (*)    All other components within normal limits  URINALYSIS, ROUTINE W REFLEX MICROSCOPIC - Abnormal; Notable for the following components:   pH 9.0 (*)    All other components within normal limits  ETHANOL  LIPASE, BLOOD    EKG EKG Interpretation  Date/Time:  Friday June 17 2022 19:31:00 EDT Ventricular Rate:  127 PR Interval:  126 QRS Duration: 70 QT Interval:  298 QTC Calculation: 433 R Axis:   71 Text Interpretation: Sinus tachycardia Otherwise normal ECG When compared with ECG of 30-May-2021 13:15, HEART RATE has increased Confirmed by Dione Booze ([id number]) on 06/17/2022 11:25:44 PM  Radiology DG Chest 2 View  Result Date: 06/17/2022 CLINICAL DATA:  Detox EXAM: CHEST - 2 VIEW COMPARISON:  05/30/2021 FINDINGS: Lungs are clear.  No pleural effusion or pneumothorax. The heart is normal in size. Lower thoracic kyphotic deformity. IMPRESSION: Normal chest radiographs. Electronically Signed   By: Charline Bills M.D.   On: 06/17/2022 21:02    Procedures Procedures    Medications Ordered in ED Medications  LORazepam (ATIVAN) tablet 2 mg (2 mg Oral Given 06/17/22 2014)  LORazepam (ATIVAN) tablet 2 mg (2 mg Oral Given 06/18/22 0603)  LORazepam (ATIVAN) tablet 2 mg (2 mg Oral Given 06/18/22 1121)    ED Course/ Medical  Decision Making/ A&P                           Medical Decision Making Risk Prescription drug management. Decision regarding hospitalization.  This patient presents to the ED with chief complaint(s) of withdrawal with pertinent past medical history of polysubstance abuse, hypertension, asthma which further complicates the presenting complaint. The complaint involves an extensive differential diagnosis and also carries with it a high risk of complications and morbidity.    The differential diagnosis includes withdrawal complicated by seizure, perceptual disturbance, etc.  Additional history obtained: Additional history obtained from  none available Records reviewed previous admission documents, Care Everywhere/External Records, and Primary Care Documents  ED Course and Reassessment: 31 year old male who presents to the emergency department with complaint of Xanax withdrawal.  Physical exam he does have fine tremor and tongue fasciculations.  He is mildly diaphoretic, anxious, reportedly having diarrhea and nausea. No acute lab findings.  There is positive benzos in his UDS.  Also positive for THC.  Given 6 mg total of Ativan here in the ED.  His initial CIWA score when he was placed in a room was 6 but this was after getting Ativan.  He does have a history of Xanax withdrawal seizure.  Given that he is taking 10 mg a day feel is appropriate to admit for observation and psych consult as well as creating a taper plan in a high risk individual.  No seizure activity here in the emergency department  Consulted and spoke with Dr. Artis Flock, hospitalist who agrees to admit the patient.  Independent labs interpretation:  The following labs were independently interpreted: CMP within normal limits, UA, CBC, ethanol, lipase normal.  UDS with benzos and THC  Independent visualization of imaging: - I independently visualized the following imaging with scope of interpretation limited to determining  acute life threatening conditions related to emergency care: Chest x-ray ordered in triage, which revealed  no acute findings  Consultation: - Consulted or discussed management/test interpretation w/ external professional: Dr. Artis Flock, hospitalist who agrees to admit the patient  Consideration for admission or further workup: Admission for Xanax withdrawal and potential for withdrawal seizure Social Determinants of health: Substance abuse Final Clinical Impression(s) / ED Diagnoses Final diagnoses:  Benzodiazepine withdrawal with complication [facility])    Rx / DC Orders ED Discharge Orders     None         Cristopher Peru, PA-C 06/18/22 1336    Mardene Sayer, MD 06/18/22 [phone]

## 2022-06-18 NOTE — Assessment & Plan Note (Addendum)
31 year old with history of BZD use for anxiety who ran out and was getting xanax and pain medication daily  on the street x 2-3 weeks who stopped cold [place] this past Wednesday and started to go into withdrawals so came to ED -[facility] to [facility]  -I think with possibility of not having continuity of care a librium taper may be his best option. He is interested in coming off or going back on his klonopin regimen; however, he was never given enough klonopin per  Month to take TID as he is stating. Discussed with him for his health would try to avoid BZD at all costs -librium taper protocol ordered -psychiatry consult to help with anxiety, increased his lexapro to 20mg  and started back his lyrica at 50mg  TID (typically takes 100mg  TID)  -SW consult as he is interested in inpatient rehab; however, he has no insurance. Hopefully SW will have some other options.  -seizure precautions

## 2022-06-19 DIAGNOSIS — F410 Panic disorder [episodic paroxysmal anxiety] without agoraphobia: Secondary | ICD-10-CM

## 2022-06-19 DIAGNOSIS — F411 Generalized anxiety disorder: Secondary | ICD-10-CM

## 2022-06-19 DIAGNOSIS — F1320 Sedative, hypnotic or anxiolytic dependence, uncomplicated: Secondary | ICD-10-CM

## 2022-06-19 DIAGNOSIS — I10 Essential (primary) hypertension: Secondary | ICD-10-CM

## 2022-06-19 DIAGNOSIS — F321 Major depressive disorder, single episode, moderate: Secondary | ICD-10-CM

## 2022-06-19 LAB — CBC
HCT: 43.5 % (ref 39.0–52.0)
Hemoglobin: 14.3 g/dL (ref 13.0–17.0)
MCH: 30.4 pg (ref 26.0–34.0)
MCHC: 32.9 g/dL (ref 30.0–36.0)
MCV: 92.4 fL (ref 80.0–100.0)
Platelets: 330 10*3/uL (ref 150–400)
RBC: 4.71 MIL/uL (ref 4.22–5.81)
RDW: 13.1 % (ref 11.5–15.5)
WBC: 10.4 10*3/uL (ref 4.0–10.5)
nRBC: 0 % (ref 0.0–0.2)

## 2022-06-19 LAB — BASIC METABOLIC PANEL
Anion gap: 14 (ref 5–15)
BUN: 6 mg/dL (ref 6–20)
CO2: 22 mmol/L (ref 22–32)
Calcium: 9.4 mg/dL (ref 8.9–10.3)
Chloride: 102 mmol/L (ref 98–111)
Creatinine, Ser: 0.91 mg/dL (ref 0.61–1.24)
GFR, Estimated: >60 mL/min (ref >60)
Glucose, Bld: 97 mg/dL (ref 70–99)
Potassium: 3.8 mmol/L (ref 3.5–5.1)
Sodium: 138 mmol/L (ref 135–145)

## 2022-06-19 LAB — MAGNESIUM: Magnesium: 1.8 mg/dL (ref 1.7–2.4)

## 2022-06-19 MED ORDER — ONDANSETRON 4 MG PO TBDP: 4.0000 mg | ORAL_TABLET | Freq: Four times a day (QID) | ORAL | Status: AC | PRN

## 2022-06-19 MED ORDER — LORAZEPAM 1 MG PO TABS
1.0000 mg | ORAL_TABLET | Freq: Four times a day (QID) | ORAL | Status: AC
  Administered 2022-06-19 (×3): 1 mg via ORAL
  Filled 2022-06-19 (×3): qty 1

## 2022-06-19 MED ORDER — THIAMINE MONONITRATE 100 MG PO TABS: 100.0000 mg | ORAL_TABLET | Freq: Every day | ORAL | Status: DC

## 2022-06-19 MED ORDER — LISINOPRIL 2.5 MG PO TABS
5.0000 mg | ORAL_TABLET | Freq: Every day | ORAL | Status: DC
  Administered 2022-06-19 – 2022-06-22 (×4): 5 mg via ORAL
  Filled 2022-06-19 (×3): qty 2
  Filled 2022-06-19: qty 1

## 2022-06-19 MED ORDER — HYDROXYZINE HCL 25 MG PO TABS: 25.0000 mg | ORAL_TABLET | Freq: Four times a day (QID) | ORAL | Status: AC | PRN

## 2022-06-19 MED ORDER — ADULT MULTIVITAMIN W/MINERALS CH
1.0000 | ORAL_TABLET | Freq: Every day | ORAL | Status: DC
  Administered 2022-06-19 – 2022-06-22 (×4): 1 via ORAL
  Filled 2022-06-19 (×4): qty 1

## 2022-06-19 MED ORDER — LACTATED RINGERS IV SOLN: INTRAVENOUS | Status: AC

## 2022-06-19 MED ORDER — THIAMINE HCL 100 MG/ML IJ SOLN
100.0000 mg | Freq: Every day | INTRAMUSCULAR | Status: DC
  Administered 2022-06-19 – 2022-06-20 (×2): 100 mg via INTRAVENOUS
  Filled 2022-06-19 (×2): qty 2

## 2022-06-19 MED ORDER — LOPERAMIDE HCL 2 MG PO CAPS: 2.0000 mg | ORAL_CAPSULE | ORAL | Status: AC | PRN

## 2022-06-19 MED ORDER — QUETIAPINE FUMARATE 100 MG PO TABS
100.0000 mg | ORAL_TABLET | Freq: Every day | ORAL | Status: DC
  Administered 2022-06-19 – 2022-06-21 (×3): 100 mg via ORAL
  Filled 2022-06-19 (×3): qty 1

## 2022-06-19 MED ORDER — LORAZEPAM 1 MG PO TABS: 1.0000 mg | ORAL_TABLET | Freq: Four times a day (QID) | ORAL | Status: AC | PRN

## 2022-06-19 MED ORDER — PREGABALIN 100 MG PO CAPS
100.0000 mg | ORAL_CAPSULE | Freq: Three times a day (TID) | ORAL | Status: DC
  Administered 2022-06-19 – 2022-06-22 (×9): 100 mg via ORAL
  Filled 2022-06-19 (×9): qty 1

## 2022-06-19 MED ORDER — LORAZEPAM 1 MG PO TABS
1.0000 mg | ORAL_TABLET | Freq: Three times a day (TID) | ORAL | Status: AC
  Administered 2022-06-20 (×3): 1 mg via ORAL
  Filled 2022-06-19 (×3): qty 1

## 2022-06-19 MED ORDER — CLONIDINE HCL 0.1 MG PO TABS
0.1000 mg | ORAL_TABLET | Freq: Three times a day (TID) | ORAL | Status: DC
  Filled 2022-06-19: qty 1

## 2022-06-19 MED ORDER — HEPARIN SODIUM (PORCINE) 5000 UNIT/ML IJ SOLN
5000.0000 [IU] | Freq: Three times a day (TID) | INTRAMUSCULAR | Status: DC
  Administered 2022-06-19 – 2022-06-22 (×9): 5000 [IU] via SUBCUTANEOUS
  Filled 2022-06-19 (×9): qty 1

## 2022-06-19 MED ORDER — LORAZEPAM 1 MG PO TABS
1.0000 mg | ORAL_TABLET | Freq: Two times a day (BID) | ORAL | Status: AC
  Administered 2022-06-21 (×2): 1 mg via ORAL
  Filled 2022-06-19 (×2): qty 1

## 2022-06-19 MED ORDER — THIAMINE HCL 100 MG/ML IJ SOLN: 100.0000 mg | Freq: Once | INTRAMUSCULAR | Status: DC

## 2022-06-19 MED ORDER — LORAZEPAM 1 MG PO TABS
1.0000 mg | ORAL_TABLET | Freq: Every day | ORAL | Status: AC
  Administered 2022-06-22: 1 mg via ORAL
  Filled 2022-06-19: qty 1

## 2022-06-19 MED ORDER — MAGNESIUM SULFATE IN D5W 1-5 GM/100ML-% IV SOLN
1.0000 g | Freq: Once | INTRAVENOUS | Status: AC
  Administered 2022-06-19: 1 g via INTRAVENOUS
  Filled 2022-06-19: qty 100

## 2022-06-19 NOTE — Consult Note (Signed)
[facility] Face-to-Face Psychiatry Consult   Reason for Consult: '' anxiety On klonopin TID, ran out then got xanax on street. Now withdrawing'' Referring Physician:  Susa Raring, MD Patient Identification: Scott Holland MRN:  [id number] Principal Diagnosis: Benzodiazepine withdrawal (HCC) Diagnosis:  Principal Problem:   Benzodiazepine withdrawal (HCC) Active Problems:   Depression, major, single episode, moderate (HCC)   Generalized anxiety disorder with panic attacks   Essential hypertension   Severe benzodiazepine use disorder (HCC)   GERD (gastroesophageal reflux disease)   Poor dentition   Total Time spent with patient: 1 hour  Subjective:   Scott Holland is a 31 y.o. male patient admitted with withdrawal from benzodiazepine.  HPI:  Patient  is a 31 y.o. male with medical history significant of generalized anxiety disorder, Depression, asthma, HTN, Severe Benzodiazepine use disorder who presented to ED with complaints of withdrawal symptoms from Xanax. Patient reports that he was prescribed Klonopin 1 mg TID as needed for anxiety, Lyrical and Lexapro for his anxiety/depression,  but ran out a few weeks ago because he could not afford to go back to the doctor. To prevent withdrawal, he resorted to buying Xanax and pain medication off the street and was taking anywhere from 6-10 mg of Xanax daily. He states that he stopped cold [place] this past Wednesday after he ran out of money. He came to the hospital after he started experiencing withdrawal symptoms characterized by diaphoresis, nausea and vomiting, tremors and anxiety. However, urine toxicology done at the ED on Friday was only positive for Benzodiazepine, THC and not Opiates. He reports history of being hospitalized for BZD withdrawal in 2021. He is interested in Lorazepam detox which he claims helped in the past more than Librium Detox and would like to be referred to inpatient drug/alcohol rehab facility after he is medically  stabilized.   Past Psychiatric History: as above  Risk to Self:  denies Risk to Others:  denies Prior Inpatient Therapy:  yes in 2021 Prior Outpatient Therapy:  yes  Past Medical History:  Past Medical History:  Diagnosis Date   Anxiety    Asthma    Depression    Drug use    Hypertension     Past Surgical History:  Procedure Laterality Date   FRACTURE SURGERY     Family History:  Family History  Problem Relation Age of Onset   Hypertension Mother    Anxiety disorder Mother    Hypertension Maternal Grandmother    Anxiety disorder Maternal Grandmother    Hypertension Maternal Uncle    Anxiety disorder Maternal Uncle    Family Psychiatric  History:   Social History:  Social History   Substance and Sexual Activity  Alcohol Use Not Currently   Alcohol/week: 1.0 - 2.0 standard drink of alcohol   Types: 1 - 2 Standard drinks or equivalent per week     Social History   Substance and Sexual Activity  Drug Use Yes   Types: Marijuana   Comment: daily    Social History   Socioeconomic History   Marital status: Significant Other    Spouse name: Not on file   Number of children: Not on file   Years of education: Not on file   Highest education level: Not on file  Occupational History   Occupation: Server  Tobacco Use   Smoking status: Former    Packs/day: 1.00    Years: 3.00    Total pack years: 3.00    Types: Cigarettes    Quit date:  08/22/2017    Years since quitting: 4.8   Smokeless tobacco: Never  Vaping Use   Vaping Use: Every day  Substance and Sexual Activity   Alcohol use: Not Currently    Alcohol/week: 1.0 - 2.0 standard drink of alcohol    Types: 1 - 2 Standard drinks or equivalent per week   Drug use: Yes    Types: Marijuana    Comment: daily   Sexual activity: Yes    Partners: Female    Birth control/protection: None  Other Topics Concern   Not on file  Social History Narrative   Not on file   Social Determinants of Health    Financial Resource Strain: Not on file  Food Insecurity: Not on file  Transportation Needs: Not on file  Physical Activity: Not on file  Stress: Not on file  Social Connections: Not on file   Additional Social History:    Allergies:  No Known Allergies  Labs:  Results for orders placed or performed during the hospital encounter of 06/17/22 (from the past 48 hour(s))  Rapid urine drug screen (hospital performed)     Status: Abnormal   Collection Time: 06/17/22  6:43 PM  Result Value Ref Range   Opiates NONE DETECTED NONE DETECTED   Cocaine NONE DETECTED NONE DETECTED   Benzodiazepines POSITIVE (A) NONE DETECTED   Amphetamines NONE DETECTED NONE DETECTED   Tetrahydrocannabinol POSITIVE (A) NONE DETECTED   Barbiturates NONE DETECTED NONE DETECTED    Comment: (NOTE) DRUG SCREEN FOR MEDICAL PURPOSES ONLY.  IF CONFIRMATION IS NEEDED FOR ANY PURPOSE, NOTIFY LAB WITHIN 5 DAYS.  LOWEST DETECTABLE LIMITS FOR URINE DRUG SCREEN Drug Class                     Cutoff (ng/mL) Amphetamine and metabolites    1000 Barbiturate and metabolites    200 Benzodiazepine                 200 Opiates and metabolites        300 Cocaine and metabolites        300 THC                            50 Performed at [facility], [address] [place], [place] [address]   Urinalysis, Routine w reflex microscopic     Status: Abnormal   Collection Time: 06/17/22  6:43 PM  Result Value Ref Range   Color, Urine YELLOW YELLOW   APPearance CLEAR CLEAR   Specific Gravity, Urine 1.020 1.005 - 1.030   pH 9.0 (H) 5.0 - 8.0   Glucose, UA NEGATIVE NEGATIVE mg/dL   Hgb urine dipstick NEGATIVE NEGATIVE   Bilirubin Urine NEGATIVE NEGATIVE   Ketones, ur NEGATIVE NEGATIVE mg/dL   Protein, ur NEGATIVE NEGATIVE mg/dL   Nitrite NEGATIVE NEGATIVE   Leukocytes,Ua NEGATIVE NEGATIVE    Comment: Performed at [facility], [address] [place], [place] [address]  Ethanol     Status: None   Collection  Time: 06/17/22  7:55 PM  Result Value Ref Range   Alcohol, Ethyl (B) <10 <10 mg/dL    Comment: (NOTE) Lowest detectable limit for serum alcohol is 10 mg/dL.  For medical purposes only. Performed at [facility], [address] [place], [place] [address]   Comprehensive metabolic panel     Status: Abnormal   Collection Time: 06/17/22  7:55 PM  Result Value Ref Range   Sodium 139 135 - 145 mmol/L   Potassium 4.6 3.5 - 5.1 mmol/L   Chloride 100 98 - 111 mmol/L   CO2 25 22 - 32 mmol/L   Glucose, Bld 112 (H) 70 - 99 mg/dL    Comment: Glucose reference range applies only to samples taken after fasting for at least 8 hours.   BUN 8 6 - 20 mg/dL   Creatinine, Ser 4.09 0.61 - 1.24 mg/dL   Calcium 81.1 8.9 - 91.4 mg/dL   Total Protein 7.6 6.5 - 8.1 g/dL   Albumin 4.2 3.5 - 5.0 g/dL   AST 22 15 - 41 U/L   ALT 23 0 - 44 U/L   Alkaline Phosphatase 48 38 - 126 U/L   Total Bilirubin 0.7 0.3 - 1.2 mg/dL   GFR, Estimated >78 >29 mL/min    Comment: (NOTE) Calculated using the CKD-EPI Creatinine Equation (2021)    Anion gap 14 5 - 15    Comment: Performed at [facility], [address], [place], [place] [address]  CBC     Status: Abnormal   Collection Time: 06/17/22  7:55 PM  Result Value Ref Range   WBC 11.5 (H) 4.0 - 10.5 K/uL   RBC 4.79 4.22 - 5.81 MIL/uL   Hemoglobin 15.1 13.0 - 17.0 g/dL   HCT 08.6 57.8 - 46.9 %   MCV 91.2 80.0 - 100.0 fL   MCH 31.5 26.0 - 34.0 pg   MCHC 34.6 30.0 - 36.0 g/dL   RDW 62.9 52.8 - 41.3 %   Platelets 337 150 - 400 K/uL   nRBC 0.0 0.0 - 0.2 %    Comment: Performed at [facility], [address], [place], [place] [address]  Lipase, blood     Status: None   Collection Time: 06/17/22  7:55 PM  Result Value Ref Range   Lipase 25 11 - 51 U/L    Comment: Performed at [facility], [address], [place], [place] [address]  Basic metabolic panel     Status: None   Collection Time: 06/19/22  5:31 AM  Result Value Ref Range    Sodium 138 135 - 145 mmol/L   Potassium 3.8 3.5 - 5.1 mmol/L   Chloride 102 98 - 111 mmol/L   CO2 22 22 - 32 mmol/L   Glucose, Bld 97 70 - 99 mg/dL    Comment: Glucose reference range applies only to samples taken after fasting for at least 8 hours.   BUN 6 6 - 20 mg/dL   Creatinine, Ser 3.66 0.61 - 1.24 mg/dL   Calcium 9.4 8.9 - 44.0 mg/dL   GFR, Estimated >34 >74 mL/min    Comment: (NOTE) Calculated using the CKD-EPI Creatinine Equation (2021)    Anion gap 14 5 - 15    Comment: Performed at [facility], [address] [place], [place] [address]  CBC     Status: None   Collection Time: 06/19/22  5:31 AM  Result Value Ref Range   WBC 10.4 4.0 - 10.5 K/uL   RBC 4.71 4.22 - 5.81 MIL/uL   Hemoglobin 14.3 13.0 - 17.0 g/dL   HCT 38.7 56.4 - 33.2 %   MCV 92.4 80.0 - 100.0 fL   MCH 30.4 26.0 - 34.0 pg   MCHC 32.9 30.0 - 36.0 g/dL   RDW 95.1 88.4 - 16.6 %   Platelets 330 150 - 400 K/uL  nRBC 0.0 0.0 - 0.2 %    Comment: Performed at [facility], [place] [address] [place], [place] [address]  Magnesium     Status: None   Collection Time: 06/19/22  5:31 AM  Result Value Ref Range   Magnesium 1.8 1.7 - 2.4 mg/dL    Comment: Performed at [place] [address] [place],  [address]    Current Facility-Administered Medications  Medication Dose Route Frequency Provider Last Rate Last Admin   acetaminophen (TYLENOL) tablet 650 mg  650 mg Oral Q6H PRN Orma Flaming, MD       Or   acetaminophen (TYLENOL) suppository 650 mg  650 mg Rectal Q6H PRN Orma Flaming, MD       albuterol (VENTOLIN HFA) 108 (90 Base) MCG/ACT inhaler 2 puff  2 puff Inhalation Q4H PRN Orma Flaming, MD       escitalopram (LEXAPRO) tablet 20 mg  20 mg Oral Daily Orma Flaming, MD   20 mg at 06/19/22 0936   heparin injection 5,000 Units  5,000 Units Subcutaneous Q8H Thurnell Lose, MD       hydrOXYzine (ATARAX) tablet 25 mg  25 mg Oral Q6H PRN Corena Pilgrim, MD       lactated  ringers infusion   Intravenous Continuous Thurnell Lose, MD 75 mL/hr at 06/19/22 0938 New Bag at 06/19/22 0938   lisinopril (ZESTRIL) tablet 5 mg  5 mg Oral Daily Thurnell Lose, MD   5 mg at 06/19/22 2952   loperamide (IMODIUM) capsule 2-4 mg  2-4 mg Oral PRN Sherle Mello, MD       LORazepam (ATIVAN) tablet 1 mg  1 mg Oral Q6H PRN Letzy Gullickson, MD       LORazepam (ATIVAN) tablet 1 mg  1 mg Oral QID Alondra Sahni, MD       Followed by   Derrill Memo ON 06/20/2022] LORazepam (ATIVAN) tablet 1 mg  1 mg Oral TID Corena Pilgrim, MD       Followed by   Derrill Memo ON 06/21/2022] LORazepam (ATIVAN) tablet 1 mg  1 mg Oral BID Crimson Dubberly, MD       Followed by   Derrill Memo ON 06/22/2022] LORazepam (ATIVAN) tablet 1 mg  1 mg Oral Daily Shantil Vallejo, MD       multivitamin with minerals tablet 1 tablet  1 tablet Oral Daily Siera Beyersdorf, MD       ondansetron (ZOFRAN-ODT) disintegrating tablet 4 mg  4 mg Oral Q6H PRN Milika Ventress, MD       pantoprazole (PROTONIX) EC tablet 40 mg  40 mg Oral BID Orma Flaming, MD   40 mg at 06/19/22 8413   pregabalin (LYRICA) capsule 50 mg  50 mg Oral TID Orma Flaming, MD   50 mg at 06/19/22 0936   QUEtiapine (SEROQUEL) tablet 100 mg  100 mg Oral QHS Thurnell Lose, MD       thiamine (VITAMIN B1) injection 100 mg  100 mg Intramuscular Once Corena Pilgrim, MD       [START ON 06/20/2022] thiamine (VITAMIN B1) tablet 100 mg  100 mg Oral Daily Sheila Gervasi, MD       Current Outpatient Medications  Medication Sig Dispense Refill   albuterol (VENTOLIN HFA) 108 (90 Base) MCG/ACT inhaler Inhale 2 puffs into the lungs every 4 (four) hours as needed for wheezing or shortness of breath. 18 g 3   clonazePAM (KLONOPIN) 1 MG tablet Take 1 mg by mouth 3 (  three) times daily as needed for anxiety.     dicyclomine (BENTYL) 20 MG tablet Take 1 tablet (20 mg total) by mouth 2 (two) times daily. 20 tablet 0   escitalopram (LEXAPRO) 10 MG tablet Take 1 tablet  (10 mg total) by mouth daily. 90 tablet 3   fluticasone (FLOVENT HFA) 110 MCG/ACT inhaler Inhale 2 puffs into the lungs in the morning and at bedtime. Use immediately after salmeterol (Serevent) inhaler. Rinse mouth after each use. (Patient taking differently: Inhale 2 puffs into the lungs in the morning and at bedtime. Rinse mouth after each use.) 12 g 2   ibuprofen (ADVIL) 200 MG tablet Take 4 tablets (800 mg total) by mouth every 6 (six) hours as needed. (Patient taking differently: Take 800 mg by mouth every 6 (six) hours as needed for headache, mild pain or moderate pain.)     lisinopril (ZESTRIL) 20 MG tablet TAKE 1 TABLET BY MOUTH DAILY FOR FOR BLOOD PRESSURE (Patient taking differently: Take 20 mg by mouth daily.) 90 tablet 1   metoprolol tartrate (LOPRESSOR) 25 MG tablet Take 1 tablet (25 mg total) by mouth 2 (two) times daily. NO REFILLS. OVERDUE FOR AN APPT. 60 tablet 0   pantoprazole (PROTONIX) 40 MG tablet TAKE ONE TABLET BY MOUTH TWICE A DAY (Patient taking differently: Take 40 mg by mouth 2 (two) times daily.) 60 tablet 0   pregabalin (LYRICA) 25 MG capsule Take 25 mg by mouth 3 (three) times daily.     QUEtiapine (SEROQUEL) 100 MG tablet Take 1-2 tablets (100-200 mg total) by mouth at bedtime. 60 tablet 0   SUMAtriptan (IMITREX) 100 MG tablet TAKE ONE TABLET BY MOUTH DAILY AS NEEDED FOR MIGRAINE (Patient taking differently: Take 100 mg by mouth every 2 (two) hours as needed for migraine.) 12 tablet 0   montelukast (SINGULAIR) 10 MG tablet TAKE 1 TABLET BY MOUTH AT BEDTIME (Patient not taking: Reported on 06/18/2022) 90 tablet 3    Musculoskeletal: Strength & Muscle Tone: within normal limits Gait & Station: normal Patient leans: N/A    Psychiatric Specialty Exam:  Presentation  General Appearance:  Appropriate for Environment  Eye Contact: Good  Speech: Clear and Coherent  Speech Volume: Normal  Handedness: Right   Mood and Affect   Mood: Anxious  Affect: Congruent   Thought Process  Thought Processes: Coherent; Linear  Descriptions of Associations:Intact  Orientation:Full (Time, Place and Person)  Thought Content:Logical  History of Schizophrenia/Schizoaffective disorder:No data recorded Duration of Psychotic Symptoms:No data recorded Hallucinations:Hallucinations: None  Ideas of Reference:None  Suicidal Thoughts:Suicidal Thoughts: No  Homicidal Thoughts:Homicidal Thoughts: No   Sensorium  Memory: Immediate Good; Recent Good; Remote Good  Judgment: Fair  Insight: Fair   Art therapist  Concentration: Fair  Attention Span: Good  Recall: Good  Fund of Knowledge: Good  Language: Good   Psychomotor Activity  Psychomotor Activity: Psychomotor Activity: Increased   Assets  Assets: Communication Skills; Desire for Improvement   Sleep  Sleep: Sleep: Fair   Physical Exam: Physical Exam Review of Systems  Psychiatric/Behavioral:  Positive for substance abuse. The patient is nervous/anxious.    Blood pressure 123/81, pulse 88, temperature 97.9 F (36.6 C), temperature source Oral, resp. rate 20, height 6\' 2"  (1.88 m), weight 122.5 kg, SpO2 99 %. Body mass index is 34.67 kg/m.  Treatment Plan Summary: Recommendations: Medication management: -Continue CIWA protocol -Discontinue Librium Detox protocol and switch to Lorazepam Detox protocol which patient claims helped him in the past -Continue Lexapro 20 mg  daily for anxiety/depression -Continue Lyrical 50 mg TID for anxiety/sleep -Continue Seroquel 100 mg at bedtime for mood/sleep  Plan: -Consider TOC/SW consult to facilitate inpatient drug/alcohol rehab referral once patient is medically stable  Disposition: No evidence of imminent risk to self or others at present.   Supportive therapy provided about ongoing stressors. Psychiatric service will follow patient on as needed basis  Thedore Mins,  MD 06/19/2022 10:54 AM

## 2022-06-19 NOTE — ED Notes (Signed)
Pt reporting increased anxiety and trouble sleeping, PRN meds to be given

## 2022-06-19 NOTE — Progress Notes (Signed)
PROGRESS NOTE                                                                                                                                                                                                             Patient Demographics:    Scott Holland, is a 31 y.o. male, DOB - [birth year], VFI:[id number]  Outpatient Primary MD for the patient is Luetta Nutting, DO    LOS - 0  Admit date - 06/17/2022    Chief Complaint  Patient presents with   Withdrawal       Brief Narrative (HPI from H&P)   31 y.o. male with medical history significant of anxiety and depression, asthma, HTN, who presented to ED with complaints of withdrawal symptoms from xanax. He was buying benzodiazepines and narcotics from the street for years and abusing them, he did not get this medication from a doctor. He began getting xanax and pain medication off the street and was taking anywhere from 6-10mg  of xanax/day. He stopped cold [place] this past Wednesday and now is having withdrawal symptoms.  Presented to the ER for help.   Subjective:    Scott Holland today has, No headache, No chest pain, No abdominal pain - No Nausea, No new weakness tingling or numbness, no SOB.   Assessment  & Plan :    Benzodiazepine and narcotic abuse.  Buying from street for years.  Abrupt stoppage and withdrawal.  Seen by psych, social work also consulted to provide further resources for [employer] addiction, currently on Librium along with Catapres, psych to evaluate.  Will await psych input for further changes.  Continue gentle hydration.  Counseled to quit all substance abuse.   Generalized anxiety disorder with panic attacks, depression.  Continue Lexapro, also on nighttime Seroquel, Lyrica 3 times daily has been continued, will defer further medications to psych.  He is not suicidal or homicidal. Increase lexapro to 20mg  daily  Poor dentition - Poor dentition with broken  teeth.  He has some intermittent left mandibular pain, will outpatient dental follow-up.  Essential hypertension - Continue lisinopril at lower than home dose, Catapres added for withdrawals.  Monitor.  GERD (gastroesophageal reflux disease) - Continue protonix.       Condition - Extremely Guarded  Family Communication  :  wife bedside 06/19/22  Code Status :  Full  Consults  :  Psych  PUD Prophylaxis : PPI   Procedures  :          Disposition Plan  :    Status is: Observation  DVT Prophylaxis  :     Lab Results  Component Value Date   PLT 330 06/19/2022    Diet :  Diet Order             Diet regular Room service appropriate? Yes; Fluid consistency: Thin  Diet effective now                    Inpatient Medications  Scheduled Meds:  chlordiazePOXIDE  25 mg Oral QID   Followed by   chlordiazePOXIDE  25 mg Oral TID   Followed by   Melene Muller ON 06/20/2022] chlordiazePOXIDE  25 mg Oral BH-qamhs   Followed by   Melene Muller ON 06/21/2022] chlordiazePOXIDE  25 mg Oral Daily   cloNIDine  0.1 mg Oral TID   escitalopram  20 mg Oral Daily   lisinopril  5 mg Oral Daily   pantoprazole  40 mg Oral BID   pregabalin  50 mg Oral TID   QUEtiapine  100 mg Oral QHS   Continuous Infusions:  lactated ringers 75 mL/hr at 06/19/22 0938   magnesium sulfate bolus IVPB 1 g (06/19/22 0943)   PRN Meds:.acetaminophen **OR** acetaminophen, albuterol, chlordiazePOXIDE, hydrOXYzine, loperamide, ondansetron    Objective:   Vitals:   06/19/22 0440 06/19/22 0756 06/19/22 0758 06/19/22 0937  BP: 137/84 131/88  123/81  Pulse: 86 86  88  Resp: (!) 22 16  20   Temp: 97.7 F (36.5 C)  97.9 F (36.6 C)   TempSrc: Oral  Oral   SpO2: 96% 99%  99%  Weight:      Height:        Wt Readings from Last 3 Encounters:  06/17/22 122.5 kg  06/10/22 122.5 kg  06/01/21 (P) 132.5 kg    No intake or output data in the 24 hours ending 06/19/22 1025   Physical Exam  Awake Alert, No new  F.N deficits, anxious affect [place].AT,PERRAL Supple Neck, No JVD,   Symmetrical Chest wall movement, Good air movement bilaterally, CTAB RRR,No Gallops,Rubs or new Murmurs,  +ve B.Sounds, Abd Soft, No tenderness,   No Cyanosis, Clubbing or edema     Data Review:    CBC Recent Labs  Lab 06/17/22 1955 06/19/22 0531  WBC 11.5* 10.4  HGB 15.1 14.3  HCT 43.7 43.5  PLT 337 330  MCV 91.2 92.4  MCH 31.5 30.4  MCHC 34.6 32.9  RDW 12.8 13.1    Electrolytes Recent Labs  Lab 06/17/22 1955 06/19/22 0531  NA 139 138  K 4.6 3.8  CL 100 102  CO2 25 22  GLUCOSE 112* 97  BUN 8 6  CREATININE 0.96 0.91  CALCIUM 10.2 9.4  AST 22  --   ALT 23  --   ALKPHOS 48  --   BILITOT 0.7  --   ALBUMIN 4.2  --   MG  --  1.8   ID Labs Recent Labs  Lab 06/17/22 1955 06/19/22 0531  WBC 11.5* 10.4  PLT 337 330  CREATININE 0.96 0.91   Radiology Reports DG Chest 2 View  Result Date: 06/17/2022 CLINICAL DATA:  Detox EXAM: CHEST - 2 VIEW COMPARISON:  05/30/2021 FINDINGS: Lungs are clear.  No pleural effusion or pneumothorax. The heart is normal in size. Lower thoracic kyphotic deformity. IMPRESSION: Normal chest radiographs. Electronically Signed  By: Charline Bills M.D.   On: 06/17/2022 21:02      Signature  Susa Raring M.D on 06/19/2022 at 10:25 AM   -  To page go to [email or web address]

## 2022-06-19 NOTE — ED Notes (Signed)
Pt stated that the librium and atarax together helped with anxiety, however would like to change to Ativan. MD aware

## 2022-06-20 MED ORDER — THIAMINE MONONITRATE 100 MG PO TABS
100.0000 mg | ORAL_TABLET | Freq: Every day | ORAL | Status: DC
  Administered 2022-06-21 – 2022-06-22 (×2): 100 mg via ORAL
  Filled 2022-06-20 (×2): qty 1

## 2022-06-20 NOTE — Consult Note (Signed)
[facility] Followup Face-to-Face Psychiatric Evaluation   Service Date: June 20, 2022 LOS:  LOS: 0 days    Assessment  [facility] is a 31 y.o. male admitted medically for 06/17/2022  7:21 PM for benzodiazepene withdrawal. He carries the psychiatric diagnoses of GAD, depression and has a past medical history of asthma, HTN. Psychiatry was consulted for "anxiety on klonopin tid, ran out then got xanax off the street. Now withdrawing" by Dr. Thedore Mins.   He is contemplative about benzodiazepine cessation. He is motivated to quit and is seeking either CDIOP or inpatient rehab to help with substance use cessation. Current outpatient psychotropic medications include klonopin 1 mg tid prn, lexapro 10 mg, seroquel 100 mg qhs, lyrica 25 mg tid and historically he has had a fair response to these medications. He was compliant with medications up until not being able to afford medications. On initial examination, patient continues to be medically . Please see plan below for detailed recommendations.   Diagnoses:  Active Hospital problems: Principal Problem:   Benzodiazepine withdrawal (HCC) Active Problems:   Depression, major, single episode, moderate (HCC)   Generalized anxiety disorder with panic attacks   Essential hypertension   Severe benzodiazepine use disorder (HCC)   GERD (gastroesophageal reflux disease)   Poor dentition     Plan  ## Safety and Observation Level:  - Based on my clinical evaluation, I estimate the patient to be at minimal risk of self harm in the current setting  ##Benzodiazepene withdrawal  ##GAD with panic attacks ##MDD -Continue CIWA protocol (Last 2: 0>2) -Continue Lorazepam Detox protocol which patient claims helped him in the past -Continue Lexapro 20 mg daily for anxiety/depression -Continue Lyrica 50 mg TID for anxiety/sleep -Continue Seroquel 100 mg at bedtime for mood/sleep  ## Medical Decision Making Capacity:  Not formally  assessed  ## Further Work-up:  -- none  -- most recent EKG on 06/17/22 had QtC of 433 -- Pertinent labwork reviewed earlier this admission includes: UDS+ for THC and benzo, negative ethanol, wnl CBC, wnl CMP  ## Disposition:  -- inpatient rehab vs halfway house with CDIOP. Discussed with patient available resources and patient is reaching out for availability today.  ## Behavioral / Environmental:  -- nothing for now  ##Legal Status Voluntary  Thank you for this consult request. Recommendations have been communicated to the primary team.  We will sign off at this time.   Park Pope, MD   NEW history  Relevant Aspects of Hospital Course:  Admitted on 06/17/2022 for benzo withdrawal.  Patient Report:  Patient seen and assessed at bedside.  Patient reports that he relapsed on benzodiazepines 6 months ago when he broke up with his fiance.  Patient reports severe depression and anxiety related to this which led to his relapse.  He reports that he had had difficulty for almost 15 years with benzodiazepine use as this was what he was started on when he was 18 to manage his generalized anxiety disorder panic attacks.  Patient states he is presently very motivated to quit as it has negatively affected multiple parts of his life as well as not wanting to return to the hospital for this.  Patient expressed concerns that he came to the hospital after speaking with family and social supports regarding his benzo use and him being afraid he would have a seizure as he had 1 in the past secondary to his benzodiazepine use.  Patient denies present SI/HI/AVH.  Patient states that he would be  interested in going to either inpatient rehab or CD IOP.  Patient did express interest in possibly going to a halfway house as this would motivate him to abstain from substance use as this would directly be linked with his housing.  In discussion regarding his psychiatric medications, patient states that Lyrica and  Lexapro have both been helpful in managing his GAD with panic attacks.  He is optimistic that he would be able to be continued on these so that he will not have to return to benzodiazepines to manage his anxiety.  He understands a large part of abstinence from benzo use will be on him choosing to abstain.  He was provided resources by [facility] to reach out to in order to attend outpatient rehab.  He states that he will be making those calls today to see if there is any availability.  ROS:  Denies present SI/HI/AVH.   Psychiatric History:  Information collected from patient. Longstanding history of anxiety with panic attacks.  Previously used benzodiazepines to manage but has recently recognized that his dependence on benzodiazepine is problematic and he needs to cease all benzo use.   Social History:  Tobacco use: Denies Alcohol use: Sober for 2.5 years Drug use: Benzodiazepine. Had klonopin prescription but then started using xanax from the street.   Family History:  The patient's family history includes Anxiety disorder in his maternal grandmother, maternal uncle, and mother; Hypertension in his maternal grandmother, maternal uncle, and mother.  Medical History: Past Medical History:  Diagnosis Date   Anxiety    Asthma    Depression    Drug use    Hypertension     Surgical History: Past Surgical History:  Procedure Laterality Date   FRACTURE SURGERY      Medications:   Current Facility-Administered Medications:    acetaminophen (TYLENOL) tablet 650 mg, 650 mg, Oral, Q6H PRN, 650 mg at 06/19/22 2128 **OR** acetaminophen (TYLENOL) suppository 650 mg, 650 mg, Rectal, Q6H PRN, Orland Mustard, MD   albuterol (VENTOLIN HFA) 108 (90 Base) MCG/ACT inhaler 2 puff, 2 puff, Inhalation, Q4H PRN, Orland Mustard, MD   escitalopram (LEXAPRO) tablet 20 mg, 20 mg, Oral, Daily, Orland Mustard, MD, 20 mg at 06/19/22 0936   heparin injection 5,000 Units, 5,000 Units, Subcutaneous, Q8H, Leroy Sea, MD, 5,000 Units at 06/20/22 0973   hydrOXYzine (ATARAX) tablet 25 mg, 25 mg, Oral, Q6H PRN, Jannifer Franklin, Mojeed, MD   lactated ringers infusion, , Intravenous, Continuous, Leroy Sea, MD, Last Rate: 75 mL/hr at 06/19/22 0938, New Bag at 06/19/22 0938   lisinopril (ZESTRIL) tablet 5 mg, 5 mg, Oral, Daily, Leroy Sea, MD, 5 mg at 06/19/22 0936   loperamide (IMODIUM) capsule 2-4 mg, 2-4 mg, Oral, PRN, Akintayo, Mojeed, MD   LORazepam (ATIVAN) tablet 1 mg, 1 mg, Oral, Q6H PRN, Akintayo, Mojeed, MD   LORazepam (ATIVAN) tablet 1 mg, 1 mg, Oral, QID, 1 mg at 06/19/22 2127 **FOLLOWED BY** LORazepam (ATIVAN) tablet 1 mg, 1 mg, Oral, TID **FOLLOWED BY** [START ON 06/21/2022] LORazepam (ATIVAN) tablet 1 mg, 1 mg, Oral, BID **FOLLOWED BY** [START ON 06/22/2022] LORazepam (ATIVAN) tablet 1 mg, 1 mg, Oral, Daily, Akintayo, Mojeed, MD   multivitamin with minerals tablet 1 tablet, 1 tablet, Oral, Daily, Akintayo, Mojeed, MD, 1 tablet at 06/19/22 1329   ondansetron (ZOFRAN-ODT) disintegrating tablet 4 mg, 4 mg, Oral, Q6H PRN, Akintayo, Mojeed, MD   pantoprazole (PROTONIX) EC tablet 40 mg, 40 mg, Oral, BID, Orland Mustard, MD, 40 mg at 06/19/22  2127   pregabalin (LYRICA) capsule 100 mg, 100 mg, Oral, TID, Thurnell Lose, MD, 100 mg at 06/19/22 2127   QUEtiapine (SEROQUEL) tablet 100 mg, 100 mg, Oral, QHS, Thurnell Lose, MD, 100 mg at 06/19/22 2127   thiamine (VITAMIN B1) injection 100 mg, 100 mg, Intravenous, Daily, Thurnell Lose, MD, 100 mg at 06/19/22 1330  Allergies: No Known Allergies     Objective  Vital signs:  Temp:  [97.3 F (36.3 C)-98.3 F (36.8 C)] 97.3 F (36.3 C) (10/30 0413) Pulse Rate:  [65-93] 65 (10/30 0413) Resp:  [16-20] 18 (10/30 0413) BP: (107-143)/(60-94) 115/71 (10/30 0413) SpO2:  [97 %-99 %] 99 % (10/30 0413)  Psychiatric Specialty Exam:  Presentation  General Appearance:  Appropriate for Environment  Eye Contact: Good  Speech: Clear and  Coherent  Speech Volume: Normal  Handedness: Right   Mood and Affect  Mood: Anxious  Affect: Congruent   Thought Process  Thought Processes: Coherent; Linear  Descriptions of Associations:Intact  Orientation:Full (Time, Place and Person)  Thought Content:Logical  History of Schizophrenia/Schizoaffective disorder:No data recorded Duration of Psychotic Symptoms:No data recorded Hallucinations:Hallucinations: None  Ideas of Reference:None  Suicidal Thoughts:Suicidal Thoughts: No  Homicidal Thoughts:Homicidal Thoughts: No   Sensorium  Memory: Immediate Good; Recent Good; Remote Good  Judgment: Fair  Insight: Fair   Community education officer  Concentration: Fair  Attention Span: Good  Recall: Good  Fund of Knowledge: Good  Language: Good   Psychomotor Activity  Psychomotor Activity: Psychomotor Activity: Increased   Assets  Assets: Communication Skills; Desire for Improvement   Sleep  Sleep: Sleep: Fair    Physical Exam: Physical Exam ROS Blood pressure 115/71, pulse 65, temperature (!) 97.3 F (36.3 C), temperature source Oral, resp. rate 18, height 6\' 2"  (1.88 m), weight 122.5 kg, SpO2 99 %. Body mass index is 34.67 kg/m.

## 2022-06-20 NOTE — Progress Notes (Signed)
PROGRESS NOTE                                                                                                                                                                                                             Patient Demographics:    [facility], is a 31 y.o. male, DOB - 06/25/1991, XN:[id number]  Outpatient Primary MD for the patient is Luetta Nutting, DO    LOS - 0  Admit date - 06/17/2022    Chief Complaint  Patient presents with   Withdrawal       Brief Narrative (HPI from H&P)   31 y.o. male with medical history significant of anxiety and depression, asthma, HTN, who presented to ED with complaints of withdrawal symptoms from xanax. He was buying benzodiazepines and narcotics from the street for years and abusing them, he did not get this medication from a doctor. He began getting xanax and pain medication off the street and was taking anywhere from 6-10mg  of xanax/day. He stopped cold [place] this past Wednesday and now is having withdrawal symptoms.  Presented to the ER for help.   Subjective:   Patient in bed, appears comfortable, denies any headache, no fever, no chest pain or pressure, no shortness of breath , no abdominal pain. No new focal weakness.   Assessment  & Plan :    Benzodiazepine and narcotic abuse.  Buying from street for years.  Abrupt stoppage and withdrawal.  Seen by psych, social work also consulted to provide further resources for [employer] addiction, seen by psych placed on clonidine along with Ativan taper, continue to monitor, clinically better. Counseled to quit all substance abuse.  If stable likely discharge after he remained stable for another 24 to 48 hours.  Generalized anxiety disorder with panic attacks, depression.  Continue Lexapro, also on nighttime Seroquel, Lyrica 3 times daily has been continued, will defer further medications to psych.  He is not suicidal or homicidal.  Increased lexapro to 20mg  daily  Poor dentition - Poor dentition with broken teeth.  He has some intermittent left mandibular pain, will outpatient dental follow-up.  Essential hypertension - Continue lisinopril at lower than home dose, Catapres added for withdrawals.  Monitor.  GERD (gastroesophageal reflux disease) - Continue protonix.       Condition - Extremely Guarded  Family Communication  :  wife  bedside 06/19/22  Code Status :  Full  Consults  :  Psych  PUD Prophylaxis : PPI   Procedures  :          Disposition Plan  :    Status is: Observation  DVT Prophylaxis  :     Lab Results  Component Value Date   PLT 330 06/19/2022    Diet :  Diet Order             Diet regular Room service appropriate? Yes; Fluid consistency: Thin  Diet effective now                    Inpatient Medications  Scheduled Meds:  escitalopram  20 mg Oral Daily   heparin injection (subcutaneous)  5,000 Units Subcutaneous Q8H   lisinopril  5 mg Oral Daily   LORazepam  1 mg Oral QID   Followed by   LORazepam  1 mg Oral TID   Followed by   Melene Muller ON 06/21/2022] LORazepam  1 mg Oral BID   Followed by   Melene Muller ON 06/22/2022] LORazepam  1 mg Oral Daily   multivitamin with minerals  1 tablet Oral Daily   pantoprazole  40 mg Oral BID   pregabalin  100 mg Oral TID   QUEtiapine  100 mg Oral QHS   thiamine (VITAMIN B1) injection  100 mg Intravenous Daily   Continuous Infusions:   PRN Meds:.acetaminophen **OR** acetaminophen, albuterol, hydrOXYzine, loperamide, LORazepam, ondansetron    Objective:   Vitals:   06/19/22 1941 06/19/22 2347 06/20/22 0413 06/20/22 0843  BP: 134/81 111/60 115/71 123/74  Pulse: 90 89 65 64  Resp: 18 18 18    Temp: (!) 97.5 F (36.4 C) 98 F (36.7 C) (!) 97.3 F (36.3 C)   TempSrc: Oral Oral Oral   SpO2: 97% 97% 99% 98%  Weight:      Height:        Wt Readings from Last 3 Encounters:  06/17/22 122.5 kg  06/10/22 122.5 kg  06/01/21 (P)  132.5 kg     Intake/Output Summary (Last 24 hours) at 06/20/2022 0924 Last data filed at 06/19/2022 1732 Gross per 24 hour  Intake 667.07 ml  Output --  Net 667.07 ml     Physical Exam  Awake Alert, No new F.N deficits, Normal affect [place].AT,PERRAL Supple Neck, No JVD,   Symmetrical Chest wall movement, Good air movement bilaterally, CTAB RRR,No Gallops, Rubs or new Murmurs,  +ve B.Sounds, Abd Soft, No tenderness,   No Cyanosis, Clubbing or edema      Data Review:    CBC Recent Labs  Lab 06/17/22 1955 06/19/22 0531  WBC 11.5* 10.4  HGB 15.1 14.3  HCT 43.7 43.5  PLT 337 330  MCV 91.2 92.4  MCH 31.5 30.4  MCHC 34.6 32.9  RDW 12.8 13.1    Electrolytes Recent Labs  Lab 06/17/22 1955 06/19/22 0531  NA 139 138  K 4.6 3.8  CL 100 102  CO2 25 22  GLUCOSE 112* 97  BUN 8 6  CREATININE 0.96 0.91  CALCIUM 10.2 9.4  AST 22  --   ALT 23  --   ALKPHOS 48  --   BILITOT 0.7  --   ALBUMIN 4.2  --   MG  --  1.8   ID Labs Recent Labs  Lab 06/17/22 1955 06/19/22 0531  WBC 11.5* 10.4  PLT 337 330  CREATININE 0.96 0.91   Radiology Reports DG Chest 2  View  Result Date: 06/17/2022 CLINICAL DATA:  Detox EXAM: CHEST - 2 VIEW COMPARISON:  05/30/2021 FINDINGS: Lungs are clear.  No pleural effusion or pneumothorax. The heart is normal in size. Lower thoracic kyphotic deformity. IMPRESSION: Normal chest radiographs. Electronically Signed   By: Julian Hy M.D.   On: 06/17/2022 21:02      Signature  Lala Lund M.D on 06/20/2022 at 9:24 AM   -  To page go to [email or web address]

## 2022-06-20 NOTE — TOC CAGE-AID Note (Signed)
Transition of Care [facility]) - CAGE-AID Screening   Patient Details  Name: [facility] MRN: [id number] Date of Birth: Sep 20, 1990  [facility]) CM/SW Contact:    Pollie Friar, RN Phone Number: 06/20/2022, 10:22 AM   Clinical Narrative: Pt with only family planning  medicaid and interested in counseling for his Benzodiazepine use. CM provided him resources that included self pay residential and outpatient counseling. Pt to make some phone calls.    CAGE-AID Screening:    Have You Ever Felt You Ought to Cut Down on Your Drinking or Drug Use?: Yes Have People Annoyed You By Critizing Your Drinking Or Drug Use?: No Have You Felt Bad Or Guilty About Your Drinking Or Drug Use?: Yes Have You Ever Had a Drink or Used Drugs First Thing In The Morning to Steady Your Nerves or to Get Rid of a Hangover?: No CAGE-AID Score: 2  Substance Abuse Education Offered: Yes (CM provided him inpatient/ outpatient counseling resources)

## 2022-06-20 NOTE — TOC Initial Note (Addendum)
Transition of Care [facility]) - Initial/Assessment Note    Patient Details  Name: Scott [facility] MRN: [id number] Date of Birth: 08-25-1990  Transition of Care [facility]) CM/SW Contact:    Pollie Friar, RN Phone Number: 06/20/2022, 10:25 AM  Clinical Narrative:                 Pt is from home with his girlfriend and mom. He has been IADL, drives self and manages his own medications.  PCP: dr Weston Settle Pt interested in counseling for benzo use after the hospital. CM has provided him the resources. CM has sent an email to Financial counseling to f/u with the patient regarding medicaid.  TOC following.  1422: CM checked on pts progress with calling for benzo counseling. He has not called any as of yet. CM will check on him again in am.  Expected Discharge Plan: Home/Self Care Barriers to Discharge: Inadequate or no insurance   Patient Goals and CMS Choice        Expected Discharge Plan and Services Expected Discharge Plan: Home/Self Care   Discharge Planning Services: CM Consult   Living arrangements for the past 2 months: Single Family Home                                      Prior Living Arrangements/Services Living arrangements for the past 2 months: Single Family Home Lives with:: Parents, Significant Other Patient language and need for interpreter reviewed:: Yes Do you feel safe going back to the place where you live?: Yes            Criminal Activity/Legal Involvement Pertinent to Current Situation/Hospitalization: No - Comment as needed  Activities of Daily Living Home Assistive Devices/Equipment: None ADL Screening (condition at time of admission) Patient's cognitive ability adequate to safely complete daily activities?: Yes Is the patient deaf or have difficulty hearing?: No Does the patient have difficulty seeing, even when wearing glasses/contacts?: No Does the patient have difficulty concentrating, remembering, or making decisions?: No Patient able  to express need for assistance with ADLs?: Yes Does the patient have difficulty dressing or bathing?: No Independently performs ADLs?: Yes (appropriate for developmental age) Does the patient have difficulty walking or climbing stairs?: No Weakness of Legs: None Weakness of Arms/Hands: None  Permission Sought/Granted                  Emotional Assessment Appearance:: Appears stated age Attitude/Demeanor/Rapport: Engaged Affect (typically observed): Accepting Orientation: : Oriented to Self, Oriented to Place, Oriented to  Time, Oriented to Situation Alcohol / Substance Use: Other (comment) Psych Involvement: No (comment)  Admission diagnosis:  Benzodiazepine withdrawal ([place]) [F13.939] Benzodiazepine withdrawal with complication [facility]) [A35.573] Patient Active Problem List   Diagnosis Date Noted   Benzodiazepine withdrawal ([place]) 06/18/2022   Poor dentition 06/18/2022   Left ankle pain 12/24/2021   Right cervical radiculopathy 09/07/2021   Seizure-like activity ([place]) 06/01/2021   Chronic neck pain 05/19/2021   Right knee lateral meniscal tear 05/06/2021   GERD (gastroesophageal reflux disease) 12/21/2020   Arthralgia of both hands 12/21/2020   Decreased libido 12/21/2020   Depression, major, single episode, moderate ([place]) 06/04/2020   Generalized anxiety disorder with panic attacks 06/04/2020   Essential hypertension 06/04/2020   Severe benzodiazepine use disorder ([place]) 05/06/2020   PCP:  Luetta Nutting, DO Pharmacy:   [facility] [id number] - [place], [place]  [address] [place] [address] Phone: [phone] Fax: [phone]  [id number], [place] - [address] AT [facility] OF [facility] & [address] [facility] [place] [address] Phone: [phone] Fax: [phone]  [facility] [id number] - [place], [place] - [address]  [place] [address] Phone:  [phone] Fax: [phone]     Social Determinants of Health (SDOH) Interventions    Readmission Risk Interventions     No data to display

## 2022-06-21 NOTE — Progress Notes (Signed)
OT Cancellation Note  Patient Details Name: Scott [facility] MRN: [id number] DOB: April 05, 1991   Cancelled Treatment:    Reason Eval/Treat Not Completed: OT screened, no needs identified, will sign off (Discussed with PT.)  [facility] 06/21/2022, 8:41 AM Maurie Boettcher, OT/L   Acute OT Clinical Specialist Acute Rehabilitation Services Pager [phone] Office [phone]

## 2022-06-21 NOTE — Evaluation (Signed)
Physical Therapy Evaluation and Discharge Patient Details Name: Tregan Read MRN: [id number] DOB: 1990/10/01 Today's Date: 06/21/2022  History of Present Illness  31 y.o. male who presented to ED 06/17/22 with complaints of withdrawal symptoms from xanax (was buying from the street for years--not prescribed). PMH significant of anxiety and depression, asthma, HTN,  Clinical Impression   Patient evaluated by Physical Therapy with no further acute PT needs identified. PT is signing off. Thank you for this referral.        Recommendations for follow up therapy are one component of a multi-disciplinary discharge planning process, led by the attending physician.  Recommendations may be updated based on patient status, additional functional criteria and insurance authorization.  Follow Up Recommendations No PT follow up      Assistance Recommended at Discharge None  Patient can return home with the following       Equipment Recommendations None recommended by PT  Recommendations for Other Services       Functional Status Assessment Patient has not had a recent decline in their functional status     Precautions / Restrictions Precautions Precautions: None      Mobility  Bed Mobility Overal bed mobility: Independent                  Transfers Overall transfer level: Independent Equipment used: None                    Ambulation/Gait Ambulation/Gait assistance: Independent Gait Distance (Feet): 400 Feet Assistive device: None Gait Pattern/deviations: WFL(Within Functional Limits)   Gait velocity interpretation: >2.62 ft/sec, indicative of community ambulatory   General Gait Details: able to perform head turns, 180 degree turns without signs of imbalance  Stairs            Wheelchair Mobility    Modified Rankin (Stroke Patients Only)       Balance Overall balance assessment: Independent                                            Pertinent Vitals/Pain Pain Assessment Pain Assessment: No/denies pain    Home Living Family/patient expects to be discharged to:: Private residence Living Arrangements: Spouse/significant other;Children                 Additional Comments: Additional information not obtained as pt independent    Prior Function Prior Level of Function : Independent/Modified Independent                     Hand Dominance        Extremity/Trunk Assessment   Upper Extremity Assessment Upper Extremity Assessment: Overall WFL for tasks assessed    Lower Extremity Assessment Lower Extremity Assessment: Overall WFL for tasks assessed    Cervical / Trunk Assessment Cervical / Trunk Assessment: Normal  Communication   Communication: No difficulties  Cognition Arousal/Alertness: Awake/alert Behavior During Therapy: WFL for tasks assessed/performed Overall Cognitive Status: Within Functional Limits for tasks assessed                                          General Comments      Exercises     Assessment/Plan    PT Assessment Patient does not need any further PT services  PT Problem List         PT Treatment Interventions      PT Goals (Current goals can be found in the Care Plan section)  Acute Rehab PT Goals Patient Stated Goal: set up OP drug rehab for accountability PT Goal Formulation: All assessment and education complete, DC therapy    Frequency       Co-evaluation               AM-PAC PT "6 Clicks" Mobility  Outcome Measure Help needed turning from your back to your side while in a flat bed without using bedrails?: None Help needed moving from lying on your back to sitting on the side of a flat bed without using bedrails?: None Help needed moving to and from a bed to a chair (including a wheelchair)?: None Help needed standing up from a chair using your arms (e.g., wheelchair or bedside chair)?: None Help needed to walk  in hospital room?: None Help needed climbing 3-5 steps with a railing? : None 6 Click Score: 24    End of Session   Activity Tolerance: Patient tolerated treatment well Patient left: in bed;with call bell/phone within reach;with family/visitor present Nurse Communication: Mobility status PT Visit Diagnosis: Difficulty in walking, not elsewhere classified (R26.2)    Time: 4166-0630 PT Time Calculation (min) (ACUTE ONLY): 8 min   Charges:   PT Evaluation $PT Eval Low Complexity: 1 Low           Jerolyn Center, PT Acute Rehabilitation Services  Office [phone]   Zena Amos 06/21/2022, 8:38 AM

## 2022-06-21 NOTE — TOC Progression Note (Signed)
Transition of Care [facility]) - Progression Note    Patient Details  Name: Scott Holland MRN: [id number] Date of Birth: May 30, 1991  [facility]) CM/SW Contact  Pollie Friar, RN Phone Number: 06/21/2022, 11:05 AM  Clinical Narrative:    Pt states he has called several outpatient counseling places and is waiting to hear if he qualifies for financial assistance to attend.  TOC following.    Expected Discharge Plan: Home/Self Care Barriers to Discharge: Inadequate or no insurance  Expected Discharge Plan and Services Expected Discharge Plan: Home/Self Care   Discharge Planning Services: CM Consult   Living arrangements for the past 2 months: Single Family Home                                       Social Determinants of Health (SDOH) Interventions    Readmission Risk Interventions     No data to display

## 2022-06-21 NOTE — Progress Notes (Signed)
PROGRESS NOTE                                                                                                                                                                                                             Patient Demographics:    Scott Holland, is a 31 y.o. male, DOB - 06/25/1991, XN:[id number]  Outpatient Primary MD for the patient is Luetta Nutting, DO    LOS - 0  Admit date - 06/17/2022    Chief Complaint  Patient presents with   Withdrawal       Brief Narrative (HPI from H&P)   31 y.o. male with medical history significant of anxiety and depression, asthma, HTN, who presented to ED with complaints of withdrawal symptoms from xanax. He was buying benzodiazepines and narcotics from the street for years and abusing them, he did not get this medication from a doctor. He began getting xanax and pain medication off the street and was taking anywhere from 6-10mg  of xanax/day. He stopped cold [place] this past Wednesday and now is having withdrawal symptoms.  Presented to the ER for help.   Subjective:   Patient in bed, appears comfortable, denies any headache, no fever, no chest pain or pressure, no shortness of breath , no abdominal pain. No new focal weakness.   Assessment  & Plan :    Benzodiazepine and narcotic abuse.  Buying from street for years.  Abrupt stoppage and withdrawal.  Seen by psych, social work also consulted to provide further resources for [employer] addiction, seen by psych placed on clonidine along with Ativan taper, continue to monitor, clinically better. Counseled to quit all substance abuse.  If stable likely discharge after he remained stable for another 24 to 48 hours.  Generalized anxiety disorder with panic attacks, depression.  Continue Lexapro, also on nighttime Seroquel, Lyrica 3 times daily has been continued, will defer further medications to psych.  He is not suicidal or homicidal.  Increased lexapro to 20mg  daily  Poor dentition - Poor dentition with broken teeth.  He has some intermittent left mandibular pain, will outpatient dental follow-up.  Essential hypertension - Continue lisinopril at lower than home dose, Catapres added for withdrawals.  Monitor.  GERD (gastroesophageal reflux disease) - Continue protonix.       Condition - Extremely Guarded  Family Communication  :  wife  bedside 06/19/22  Code Status :  Full  Consults  :  Psych  PUD Prophylaxis : PPI   Procedures  :          Disposition Plan  :    Status is: Observation  DVT Prophylaxis  :     Lab Results  Component Value Date   PLT 330 06/19/2022    Diet :  Diet Order             Diet regular Room service appropriate? Yes; Fluid consistency: Thin  Diet effective now                    Inpatient Medications  Scheduled Meds:  escitalopram  20 mg Oral Daily   heparin injection (subcutaneous)  5,000 Units Subcutaneous Q8H   lisinopril  5 mg Oral Daily   LORazepam  1 mg Oral BID   Followed by   Derrill Memo ON 06/22/2022] LORazepam  1 mg Oral Daily   multivitamin with minerals  1 tablet Oral Daily   pantoprazole  40 mg Oral BID   pregabalin  100 mg Oral TID   QUEtiapine  100 mg Oral QHS   thiamine  100 mg Oral Daily   Continuous Infusions:   PRN Meds:.acetaminophen **OR** acetaminophen, albuterol, hydrOXYzine, loperamide, LORazepam, ondansetron    Objective:   Vitals:   06/20/22 2038 06/21/22 0053 06/21/22 0438 06/21/22 0846  BP: 137/87 123/75 111/89 131/81  Pulse: 90 83 69 70  Resp: 20 18 20  (!) 21  Temp: 98.5 F (36.9 C) 98.1 F (36.7 C) 97.9 F (36.6 C) 98.1 F (36.7 C)  TempSrc: Oral Oral Oral Oral  SpO2: 96% 98% 96% 95%  Weight:      Height:        Wt Readings from Last 3 Encounters:  06/17/22 122.5 kg  06/10/22 122.5 kg  06/01/21 (P) 132.5 kg     Intake/Output Summary (Last 24 hours) at 06/21/2022 1033 Last data filed at 06/20/2022 1200 Gross  per 24 hour  Intake 300 ml  Output --  Net 300 ml     Physical Exam  Awake Alert, No new F.N deficits, Normal affect [address].AT,PERRAL Supple Neck, No JVD,   Symmetrical Chest wall movement, Good air movement bilaterally, CTAB RRR,No Gallops, Rubs or new Murmurs,  +ve B.Sounds, Abd Soft, No tenderness,   No Cyanosis, Clubbing or edema      Data Review:    CBC Recent Labs  Lab 06/17/22 1955 06/19/22 0531  WBC 11.5* 10.4  HGB 15.1 14.3  HCT 43.7 43.5  PLT 337 330  MCV 91.2 92.4  MCH 31.5 30.4  MCHC 34.6 32.9  RDW 12.8 13.1    Electrolytes Recent Labs  Lab 06/17/22 1955 06/19/22 0531  NA 139 138  K 4.6 3.8  CL 100 102  CO2 25 22  GLUCOSE 112* 97  BUN 8 6  CREATININE 0.96 0.91  CALCIUM 10.2 9.4  AST 22  --   ALT 23  --   ALKPHOS 48  --   BILITOT 0.7  --   ALBUMIN 4.2  --   MG  --  1.8   ID Labs Recent Labs  Lab 06/17/22 1955 06/19/22 0531  WBC 11.5* 10.4  PLT 337 330  CREATININE 0.96 0.91   Radiology Reports DG Chest 2 View  Result Date: 06/17/2022 CLINICAL DATA:  Detox EXAM: CHEST - 2 VIEW COMPARISON:  05/30/2021 FINDINGS: Lungs are clear.  No pleural effusion or pneumothorax. The  heart is normal in size. Lower thoracic kyphotic deformity. IMPRESSION: Normal chest radiographs. Electronically Signed   By: Julian Hy M.D.   On: 06/17/2022 21:02      Signature  Lala Lund M.D on 06/21/2022 at 10:33 AM   -  To page go to [email or web address]

## 2022-06-22 ENCOUNTER — Telehealth (HOSPITAL_COMMUNITY): Payer: Self-pay | Admitting: Licensed Clinical Social Worker

## 2022-06-22 ENCOUNTER — Telehealth: Payer: Self-pay | Admitting: Emergency Medicine

## 2022-06-22 ENCOUNTER — Other Ambulatory Visit (HOSPITAL_COMMUNITY): Payer: Self-pay

## 2022-06-22 DIAGNOSIS — F13939 Sedative, hypnotic or anxiolytic use, unspecified with withdrawal, unspecified: Secondary | ICD-10-CM

## 2022-06-22 LAB — CBC WITH DIFFERENTIAL/PLATELET
Abs Immature Granulocytes: 0.05 10*3/uL (ref 0.00–0.07)
Basophils Absolute: 0.1 10*3/uL (ref 0.0–0.1)
Basophils Relative: 1 %
Eosinophils Absolute: 0.3 10*3/uL (ref 0.0–0.5)
Eosinophils Relative: 3 %
HCT: 39.3 % (ref 39.0–52.0)
Hemoglobin: 13.2 g/dL (ref 13.0–17.0)
Immature Granulocytes: 1 %
Lymphocytes Relative: 30 %
Lymphs Abs: 3.2 10*3/uL (ref 0.7–4.0)
MCH: 30.8 pg (ref 26.0–34.0)
MCHC: 33.6 g/dL (ref 30.0–36.0)
MCV: 91.8 fL (ref 80.0–100.0)
Monocytes Absolute: 1 10*3/uL (ref 0.1–1.0)
Monocytes Relative: 9 %
Neutro Abs: 6 10*3/uL (ref 1.7–7.7)
Neutrophils Relative %: 56 %
Platelets: 287 10*3/uL (ref 150–400)
RBC: 4.28 MIL/uL (ref 4.22–5.81)
RDW: 12.9 % (ref 11.5–15.5)
WBC: 10.6 10*3/uL — ABNORMAL HIGH (ref 4.0–10.5)
nRBC: 0 % (ref 0.0–0.2)

## 2022-06-22 LAB — COMPREHENSIVE METABOLIC PANEL
ALT: 20 U/L (ref 0–44)
AST: 17 U/L (ref 15–41)
Albumin: 3.6 g/dL (ref 3.5–5.0)
Alkaline Phosphatase: 48 U/L (ref 38–126)
Anion gap: 12 (ref 5–15)
BUN: 9 mg/dL (ref 6–20)
CO2: 28 mmol/L (ref 22–32)
Calcium: 9.5 mg/dL (ref 8.9–10.3)
Chloride: 100 mmol/L (ref 98–111)
Creatinine, Ser: 0.9 mg/dL (ref 0.61–1.24)
GFR, Estimated: >60 mL/min (ref >60)
Glucose, Bld: 106 mg/dL — ABNORMAL HIGH (ref 70–99)
Potassium: 3.9 mmol/L (ref 3.5–5.1)
Sodium: 140 mmol/L (ref 135–145)
Total Bilirubin: 0.3 mg/dL (ref 0.3–1.2)
Total Protein: 6.5 g/dL (ref 6.5–8.1)

## 2022-06-22 LAB — BRAIN NATRIURETIC PEPTIDE: B Natriuretic Peptide: 4.4 pg/mL (ref 0.0–100.0)

## 2022-06-22 LAB — MAGNESIUM: Magnesium: 1.8 mg/dL (ref 1.7–2.4)

## 2022-06-22 MED ORDER — THIAMINE HCL 100 MG PO TABS
100.0000 mg | ORAL_TABLET | Freq: Every day | ORAL | 0 refills | Status: DC
  Filled 2022-06-22: qty 30, 30d supply, fill #0

## 2022-06-22 MED ORDER — ESCITALOPRAM OXALATE 20 MG PO TABS
20.0000 mg | ORAL_TABLET | Freq: Every day | ORAL | 0 refills | Status: DC
  Filled 2022-06-22: qty 30, 30d supply, fill #0

## 2022-06-22 MED ORDER — LORAZEPAM 0.5 MG PO TABS
ORAL_TABLET | ORAL | 0 refills | Status: DC
  Filled 2022-06-22: qty 12, 7d supply, fill #0

## 2022-06-22 MED ORDER — PREGABALIN 100 MG PO CAPS
100.0000 mg | ORAL_CAPSULE | Freq: Three times a day (TID) | ORAL | 0 refills | Status: DC
  Filled 2022-06-22: qty 30, 10d supply, fill #0

## 2022-06-22 NOTE — TOC Transition Note (Signed)
Transition of Care [facility]) - CM/SW Discharge Note   Patient Details  Name: Scott Holland MRN: [id number] Date of Birth: 1991/01/22  Transition [facility] Care [facility]) CM/SW Contact:  Pollie Friar, RN Phone Number: 06/22/2022, 9:40 AM   Clinical Narrative:    Pt is discharging home with self care. Pt has called and working on outpatient counseling for his benzo use.  Medications for home to be delivered to the room per [place]. Pt has transportation home.    Final next level of care: Home/Self Care Barriers to Discharge: Inadequate or no insurance, Barriers Unresolved (comment)   Patient Goals and CMS Choice        Discharge Placement                       Discharge Plan and Services   Discharge Planning Services: CM Consult                                 Social Determinants of Health (SDOH) Interventions     Readmission Risk Interventions     No data to display

## 2022-06-22 NOTE — Discharge Summary (Signed)
Scott Holland NWG:[id number] DOB: 1991/01/04 DOA: 06/17/2022  PCP: Luetta Nutting, DO  Admit date: 06/17/2022  Discharge date: 06/22/2022  Admitted From: Home   Disposition:  Home   Recommendations for Outpatient Follow-up:   Follow up with PCP in 1-2 weeks  PCP Please obtain BMP/CBC, 2 view CXR in 1week,  (see Discharge instructions)   PCP Please follow up on the following pending results: Needs outpatient psychiatry and dental follow-up within 7 to 10 days.   Home Health: None   Equipment/Devices: None  Consultations: Psych Discharge Condition: Stable    CODE STATUS: Full    Diet Recommendation: Heart Healthy     Chief Complaint  Patient presents with   Withdrawal     Brief history of present illness from the day of admission and additional interim summary    31 y.o. male with medical history significant of anxiety and depression, asthma, HTN, who presented to ED with complaints of withdrawal symptoms from xanax. He was buying benzodiazepines and narcotics from the street for years and abusing them, he did not get this medication from a doctor. He began getting xanax and pain medication off the street and was taking anywhere from 6-10mg  of xanax/day. He stopped cold [place] this past Wednesday and now is having withdrawal symptoms.  Presented to the ER for help.                                                                 Hospital Course   Benzodiazepine and narcotic abuse.  Buying from street for years.  Abrupt stoppage and withdrawal.  Seen by psych, social work also consulted to provide further resources for Deaddiction, he was placed on higer than home dose Lyrica along with Lexapro, was placed on Ativan taper, much improved and symptom-free at this point.  No signs of withdrawal.  Will be discharged  home on a short Ativan taper with outpatient follow-up with PCP and primary psychiatrist.  Social work has provided him with outpatient resources as well.  generalized anxiety disorder with panic attacks, depression.  Continue Lexapro, also on nighttime Seroquel, Lyrica 3 times daily has been continued, will defer further medications to psych.  He is not suicidal or homicidal. Increased lexapro to 20mg  daily per psych.  Outpatient follow-up with PCP and primary psychiatrist postdischarge.   Poor dentition - Poor dentition with broken teeth.  He has some intermittent left mandibular pain, will outpatient dental follow-up be arranged by PCP.   Essential hypertension - Continue home medications and follow-up with PCP.   GERD (gastroesophageal reflux disease) - Continue protonix.      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[place])    Discharge instructions    Discharge Instructions     Diet - low sodium heart healthy   Complete by: As directed    Discharge instructions   Complete by: As directed    Follow with Primary MD Luetta Nutting, DO in 7 days   Get CBC, CMP, Magnesium, TSH, A1c  -  checked next visit within 1 week by Primary MD   Activity: As tolerated with Full fall precautions use walker/cane & assistance as needed  Disposition Home    Diet: Heart Healthy    Special Instructions: If you have smoked or chewed Tobacco  in the last 2 yrs please stop smoking, stop any regular Alcohol  and or any Recreational drug use.  On your next visit with your primary care physician please Get Medicines reviewed and adjusted.  Please request your Prim.MD to go over all Hospital Tests and Procedure/Radiological  results at the follow up, please get all Hospital records sent to your Prim MD by signing hospital release before you go home.  If you experience worsening of your admission symptoms, develop shortness of breath, life threatening emergency, suicidal or homicidal thoughts you must seek medical attention immediately by calling 911 or calling your MD immediately  if symptoms less severe.  You Must read complete instructions/literature along with all the possible adverse reactions/side effects for all the Medicines you take and that have been prescribed to you. Take any new Medicines after you have completely understood and accpet all the possible adverse reactions/side effects.   Do not drive, operate heavy machinery, perform activities at heights, swimming or participation in water activities or provide baby sitting services if your were admitted for syncope or siezures until you have seen by Primary MD or a Neurologist and advised to do so again.  Do not drive when taking Pain medications.  Do not take more than prescribed Pain, Sleep and Anxiety Medications  Wear Seat belts while driving.   Please note  You were cared for by a hospitalist during your hospital stay. If you have any questions about your discharge medications or the care you received while you were in the hospital after you are discharged, you can call the unit and asked to speak with the hospitalist on call if the hospitalist that took care of you is not available. Once you are discharged, your primary care physician will handle any further medical issues. Please note that NO REFILLS for any discharge medications will be authorized once you are discharged, as it is imperative that you return to your primary care physician (or establish a relationship with a primary care physician if you do not have one) for your aftercare needs so that they can reassess your need for medications and monitor your lab values.   Increase activity slowly    Complete by: As directed        Discharge Medications   Allergies as of 06/22/2022   No Known Allergies      Medication List     STOP taking these medications    KlonoPIN 1 MG tablet Generic drug: clonazePAM   montelukast 10 MG tablet Commonly known as: SINGULAIR       TAKE these medications    albuterol 108 (90 Base) MCG/ACT inhaler Commonly known as: VENTOLIN HFA Inhale 2 puffs into the lungs every 4 (four) hours as needed for wheezing or shortness  of breath.   dicyclomine 20 MG tablet Commonly known as: BENTYL Take 1 tablet (20 mg total) by mouth 2 (two) times daily.   escitalopram 20 MG tablet Commonly known as: LEXAPRO Take 1 tablet (20 mg total) by mouth daily. What changed:  medication strength how much to take   fluticasone 110 MCG/ACT inhaler Commonly known as: FLOVENT HFA Inhale 2 puffs into the lungs in the morning and at bedtime. Use immediately after salmeterol (Serevent) inhaler. Rinse mouth after each use. What changed: additional instructions   ibuprofen 200 MG tablet Commonly known as: Advil Take 4 tablets (800 mg total) by mouth every 6 (six) hours as needed. What changed: reasons to take this   lisinopril 20 MG tablet Commonly known as: ZESTRIL TAKE 1 TABLET BY MOUTH DAILY FOR FOR BLOOD PRESSURE What changed: See the new instructions.   LORazepam 0.5 MG tablet Commonly known as: ATIVAN Take 1 pill 3 times a day today, take 1 pill 2 times a day for 3 days, then take 1 pill once a day for 3 days then stop   metoprolol tartrate 25 MG tablet Commonly known as: LOPRESSOR Take 1 tablet (25 mg total) by mouth 2 (two) times daily. NO REFILLS. OVERDUE FOR AN APPT.   pantoprazole 40 MG tablet Commonly known as: PROTONIX TAKE ONE TABLET BY MOUTH TWICE A DAY   pregabalin 100 MG capsule Commonly known as: LYRICA Take 1 capsule (100 mg total) by mouth 3 (three) times daily. What changed:  medication strength how much to take    QUEtiapine 100 MG tablet Commonly known as: SEROQUEL Take 1-2 tablets (100-200 mg total) by mouth at bedtime.   SUMAtriptan 100 MG tablet Commonly known as: IMITREX TAKE ONE TABLET BY MOUTH DAILY AS NEEDED FOR MIGRAINE What changed:  how much to take how to take this when to take this reasons to take this additional instructions   thiamine 100 MG tablet Commonly known as: Vitamin B-1 Take 1 tablet (100 mg total) by mouth daily.         Follow-up Information     [facility] Follow up.   Why: CaLL ASAP TO ESTABLISH A PRIMARY CARE DOCTOR THEY HAVE FFINAICAL COUNSELLORS AND A PHARMACY Contact information: [address] [place] [address] [phone]        Department, [facility] Follow up.   Why: Dental - apply for services Contact information: [address] [place] [address] [phone]         Everrett Coombe, DO. Schedule an appointment as soon as possible for a visit in 1 week(s).   Specialty: Family Medicine Contact information: [address]  [address] [place] [address] [phone]         Inc[facility] Follow up.   Why: Please call and follow up here for outpatient psychiatry and therapy and inquire about CD-IOP options. Contact information: [address] [place] [address] [phone]                 Major procedures and Radiology Reports - PLEASE review detailed and final reports thoroughly  -      DG Chest 2 View  Result Date: 06/17/2022 CLINICAL DATA:  Detox EXAM: CHEST - 2 VIEW COMPARISON:  05/30/2021 FINDINGS: Lungs are clear.  No pleural effusion or pneumothorax. The heart is normal in size. Lower thoracic kyphotic deformity. IMPRESSION: Normal chest radiographs. Electronically Signed  By: Julian Hy M.D.   On: 06/17/2022 21:02   CT ABDOMEN PELVIS W CONTRAST  Result Date: 06/10/2022 CLINICAL  DATA:  Abdominal pain. EXAM: CT ABDOMEN AND PELVIS WITH CONTRAST TECHNIQUE: Multidetector CT imaging of the abdomen and pelvis was performed using the standard protocol following bolus administration of intravenous contrast. RADIATION DOSE REDUCTION: This exam was performed according to the departmental dose-optimization program which includes automated exposure control, adjustment of the mA and/or kV according to patient size and/or use of iterative reconstruction technique. CONTRAST:  168mL OMNIPAQUE IOHEXOL 300 MG/ML  SOLN COMPARISON:  None Available. FINDINGS: Lower chest: The visualized lung bases are clear. No intra-abdominal free air or free fluid. Hepatobiliary: Fatty liver. No biliary dilatation. The gallbladder is contracted. No calcified gallstone. Pancreas: Unremarkable. No pancreatic ductal dilatation or surrounding inflammatory changes. Spleen: Normal in size without focal abnormality. Adrenals/Urinary Tract: The adrenal glands are unremarkable. Nonobstructing right renal calculi measure up to 7 mm in the inferior pole. No hydronephrosis. The left kidney is unremarkable. The visualized ureters and urinary bladder appear unremarkable. Stomach/Bowel: There is no bowel obstruction or active inflammation. The appendix is normal. Vascular/Lymphatic: The abdominal aorta and IVC are unremarkable on this noncontrast CT. No portal venous gas. There is no adenopathy. Reproductive: The prostate and seminal vesicles are grossly unremarkable. No pelvic mass. Other: None Musculoskeletal: No acute or significant osseous findings. IMPRESSION: 1. No acute intra-abdominal or pelvic pathology. 2. Nonobstructing right renal calculi. No hydronephrosis. 3. Fatty liver. Electronically Signed   By: Anner Crete M.D.   On: 06/10/2022 23:07      Subjective    Scott Holland today has no headache,no chest abdominal pain,no new weakness tingling or numbness, feels much better wants to go home today.    Objective    Blood pressure 126/75, pulse 73, temperature 98.3 F (36.8 C), temperature source Oral, resp. rate 16, height 6\' 2"  (1.88 m), weight 122.5 kg, SpO2 99 %.   Intake/Output Summary (Last 24 hours) at 06/22/2022 0810 Last data filed at 06/21/2022 1737 Gross per 24 hour  Intake --  Output 13 ml  Net -13 ml    Exam  Awake Alert, No new F.N deficits,    [place].AT,PERRAL Supple Neck,   Symmetrical Chest wall movement, Good air movement bilaterally, CTAB RRR,No Gallops,   +ve B.Sounds, Abd Soft, Non tender,  No Cyanosis, Clubbing or edema    Data Review   Recent Labs  Lab 06/17/22 1955 06/19/22 0531 06/22/22 0529  WBC 11.5* 10.4 10.6*  HGB 15.1 14.3 13.2  HCT 43.7 43.5 39.3  PLT 337 330 287  MCV 91.2 92.4 91.8  MCH 31.5 30.4 30.8  MCHC 34.6 32.9 33.6  RDW 12.8 13.1 12.9  LYMPHSABS  --   --  3.2  MONOABS  --   --  1.0  EOSABS  --   --  0.3  BASOSABS  --   --  0.1    Recent Labs  Lab 06/17/22 1955 06/19/22 0531 06/22/22 0529  NA 139 138 140  K 4.6 3.8 3.9  CL 100 102 100  CO2 25 22 28   GLUCOSE 112* 97 106*  BUN 8 6 9   CREATININE 0.96 0.91 0.90  CALCIUM 10.2 9.4 9.5  AST 22  --  17  ALT 23  --  20  ALKPHOS 48  --  48  BILITOT 0.7  --  0.3  ALBUMIN 4.2  --  3.6  MG  --  1.8 1.8  BNP  --   --  4.4    Total Time in preparing paper work, data evaluation and todays exam - 53 minutes  Lala Lund M.D on 06/22/2022 at 8:10 AM  [facility]

## 2022-06-22 NOTE — Telephone Encounter (Signed)
The therapist receives a voicemail from Scott Holland's girlfriend asking that this therapist call her or Scott Holland as he was recently discharged from the hospital having  been there for detox.   The therapist calls Scott Holland confirming his identity via two identifiers. He says that he has been using benzodiazepines since his late teens and wants to get help.   He says that he and his girlfriend are now living in [facility] [place] [facility] having recently moved from his [place], [place] address in [place]. He has Family [employer] Medicaid only so would need to receive services via [employer].  Thus, the therapist informs him about services at the [facility] including CD IOP and provides him with Ms. Darol Destine, LCAS' name and direct number and let him know that he will message her and ask her to reach out via phone to [place] hopefully today.  [address] MA, LCSW, [facility], LCAS 06/22/2022

## 2022-06-22 NOTE — Telephone Encounter (Signed)
Copied from [place] [id number]. Topic: General - Other >> Jun 22, 2022 11:27 AM Cyndi Bender wrote: Reason for CRM: Pt would like to schedule an appt for financial assistance to get the orange card. Cb# [phone]

## 2022-06-24 ENCOUNTER — Telehealth: Payer: Self-pay | Admitting: Family Medicine

## 2022-06-27 NOTE — Telephone Encounter (Signed)
Pt sch'd new pt appt at [facility] this morning (appt isn't until Jan 2024) - needs to reestablish primary care a/f moving to [facility] a/b 5 months ago. Sch'd orange card appt for 07/12/22 at 10 AM and discussed docs. Mailing app to pt [employer] address. [place] (Zip: [phone])

## 2022-06-28 ENCOUNTER — Encounter: Payer: Self-pay | Admitting: Physician Assistant

## 2022-06-28 ENCOUNTER — Ambulatory Visit: Payer: Self-pay | Admitting: Physician Assistant

## 2022-06-28 VITALS — BP 148/103 | HR 81 | Ht 73.0 in | Wt 269.0 lb

## 2022-06-28 DIAGNOSIS — F411 Generalized anxiety disorder: Secondary | ICD-10-CM

## 2022-06-28 DIAGNOSIS — F331 Major depressive disorder, recurrent, moderate: Secondary | ICD-10-CM

## 2022-06-28 DIAGNOSIS — F99 Mental disorder, not otherwise specified: Secondary | ICD-10-CM

## 2022-06-28 DIAGNOSIS — F1320 Sedative, hypnotic or anxiolytic dependence, uncomplicated: Secondary | ICD-10-CM

## 2022-06-28 DIAGNOSIS — I10 Essential (primary) hypertension: Secondary | ICD-10-CM

## 2022-06-28 DIAGNOSIS — F5105 Insomnia due to other mental disorder: Secondary | ICD-10-CM

## 2022-06-28 MED ORDER — METOPROLOL TARTRATE 25 MG PO TABS: 25.0000 mg | ORAL_TABLET | Freq: Two times a day (BID) | ORAL | 1 refills | Status: DC

## 2022-06-28 MED ORDER — PREGABALIN 100 MG PO CAPS: 100.0000 mg | ORAL_CAPSULE | Freq: Three times a day (TID) | ORAL | 0 refills | Status: DC

## 2022-06-28 MED ORDER — QUETIAPINE FUMARATE 200 MG PO TABS: 200.0000 mg | ORAL_TABLET | Freq: Every day | ORAL | 1 refills | Status: DC

## 2022-06-28 NOTE — Progress Notes (Unsigned)
New Patient Office Visit  Subjective    Patient ID: Scott Holland, male    DOB: [birth year]  Age: 31 y.o. MRN: [id number]  CC:  Chief Complaint  Patient presents with   Meeication Refill    5 days medication refill at ED     HPI Scott Holland states that he has been out of his Seroquel for the past few days.  States that he has been working on obtaining substance abuse treatment for his benzodiazepine abuse issue.  States that he was seen in the emergency department on June 17, 2022 and given a taper of Ativan.  States that he did complete the taper as directed, but states that since he has been out of his Seroquel, which he has been taking 200 mg at bedtime, he has not been able to sleep more than 1 to 2 hours each night, and feels his anxiety levels are extremely elevated.  States that he does not check his blood pressure at home, states that he has not taking any medications today.  States that he has been out of his metoprolol.  States that he has been taking the pregabalin as directed.  States that he is prescribed this to help with his anxiety, states that he has previously failed gabapentin.    Outpatient Encounter Medications as of 06/28/2022  Medication Sig   albuterol (VENTOLIN HFA) 108 (90 Base) MCG/ACT inhaler Inhale 2 puffs into the lungs every 4 (four) hours as needed for wheezing or shortness of breath.   dicyclomine (BENTYL) 20 MG tablet Take 1 tablet (20 mg total) by mouth 2 (two) times daily.   escitalopram (LEXAPRO) 20 MG tablet Take 1 tablet (20 mg total) by mouth daily.   lisinopril (ZESTRIL) 20 MG tablet TAKE 1 TABLET BY MOUTH DAILY FOR FOR BLOOD PRESSURE (Patient taking differently: Take 20 mg by mouth daily.)   thiamine (VITAMIN B1) 100 MG tablet Take 1 tablet (100 mg total) by mouth daily.   [DISCONTINUED] metoprolol tartrate (LOPRESSOR) 25 MG tablet Take 1 tablet (25 mg total) by mouth 2 (two) times daily. NO REFILLS. OVERDUE FOR AN APPT.   fluticasone  (FLOVENT HFA) 110 MCG/ACT inhaler Inhale 2 puffs into the lungs in the morning and at bedtime. Use immediately after salmeterol (Serevent) inhaler. Rinse mouth after each use. (Patient not taking: Reported on 06/28/2022)   ibuprofen (ADVIL) 200 MG tablet Take 4 tablets (800 mg total) by mouth every 6 (six) hours as needed. (Patient not taking: Reported on 06/28/2022)   LORazepam (ATIVAN) 0.5 MG tablet Take 1 tablet 3 times today, then take 1 tablet 2 times a day for 3 days, then take 1 tablet once a day for 3 days then stop   metoprolol tartrate (LOPRESSOR) 25 MG tablet Take 1 tablet (25 mg total) by mouth 2 (two) times daily.   pantoprazole (PROTONIX) 40 MG tablet TAKE ONE TABLET BY MOUTH TWICE A DAY (Patient taking differently: Take 40 mg by mouth 2 (two) times daily.)   pregabalin (LYRICA) 100 MG capsule Take 1 capsule (100 mg total) by mouth 3 (three) times daily.   QUEtiapine (SEROQUEL) 200 MG tablet Take 1 tablet (200 mg total) by mouth at bedtime.   SUMAtriptan (IMITREX) 100 MG tablet TAKE ONE TABLET BY MOUTH DAILY AS NEEDED FOR MIGRAINE (Patient taking differently: Take 100 mg by mouth every 2 (two) hours as needed for migraine.)   [DISCONTINUED] pregabalin (LYRICA) 100 MG capsule Take 1 capsule (100 mg total) by mouth 3 (three)  times daily.   [DISCONTINUED] QUEtiapine (SEROQUEL) 100 MG tablet Take 1-2 tablets (100-200 mg total) by mouth at bedtime.   No facility-administered encounter medications on file as of 06/28/2022.    Past Medical History:  Diagnosis Date   Anxiety    Asthma    Depression    Drug use    Hypertension     Past Surgical History:  Procedure Laterality Date   FRACTURE SURGERY      Family History  Problem Relation Age of Onset   Hypertension Mother    Anxiety disorder Mother    Hypertension Maternal Grandmother    Anxiety disorder Maternal Grandmother    Hypertension Maternal Uncle    Anxiety disorder Maternal Uncle     Social History   Socioeconomic  History   Marital status: Significant Other    Spouse name: Not on file   Number of children: Not on file   Years of education: Not on file   Highest education level: Not on file  Occupational History   Occupation: Server  Tobacco Use   Smoking status: Former    Packs/day: 1.00    Years: 3.00    Total pack years: 3.00    Types: Cigarettes    Quit date: 08/22/2017    Years since quitting: 4.8   Smokeless tobacco: Never  Vaping Use   Vaping Use: Every day  Substance and Sexual Activity   Alcohol use: Not Currently    Alcohol/week: 1.0 - 2.0 standard drink of alcohol    Types: 1 - 2 Standard drinks or equivalent per week   Drug use: Yes    Types: Marijuana    Comment: daily   Sexual activity: Yes    Partners: Female    Birth control/protection: None  Other Topics Concern   Not on file  Social History Narrative   Not on file   Social Determinants of Health   Financial Resource Strain: Not on file  Food Insecurity: Not on file  Transportation Needs: Not on file  Physical Activity: Not on file  Stress: Not on file  Social Connections: Not on file  Intimate Partner Violence: Not on file    Review of Systems  Constitutional: Negative.   HENT: Negative.    Eyes: Negative.   Respiratory:  Negative for shortness of breath.   Cardiovascular:  Negative for chest pain.  Gastrointestinal: Negative.   Genitourinary: Negative.   Musculoskeletal: Negative.   Skin: Negative.   Neurological: Negative.   Endo/Heme/Allergies: Negative.   Psychiatric/Behavioral:  Positive for depression. The patient is nervous/anxious and has insomnia.         Objective    BP (!) 148/103 (BP Location: Right Arm, Patient Position: Sitting, Cuff Size: Normal)   Pulse 81   Ht 6\' 1"  (1.854 m)   Wt 269 lb (122 kg)   BMI 35.49 kg/m   Physical Exam Vitals and nursing note reviewed.  Constitutional:      Appearance: Normal appearance.  HENT:     Head: Normocephalic and atraumatic.      Right Ear: External ear normal.     Left Ear: External ear normal.     Nose: Nose normal.     Mouth/Throat:     Mouth: Mucous membranes are moist.     Dentition: Dental caries present.     Pharynx: Oropharynx is clear.  Eyes:     Extraocular Movements: Extraocular movements intact.     Conjunctiva/sclera: Conjunctivae normal.     Pupils: Pupils  are equal, round, and reactive to light.  Cardiovascular:     Rate and Rhythm: Normal rate and regular rhythm.     Pulses: Normal pulses.     Heart sounds: Normal heart sounds.  Pulmonary:     Effort: Pulmonary effort is normal.     Breath sounds: Normal breath sounds.  Musculoskeletal:        General: Normal range of motion.     Cervical back: Normal range of motion and neck supple.  Skin:    General: Skin is warm and dry.  Neurological:     General: No focal deficit present.     Mental Status: He is alert and oriented to person, place, and time.  Psychiatric:        Attention and Perception: Attention normal.        Mood and Affect: Mood is anxious.        Speech: Speech normal.        Behavior: Behavior normal.        Thought Content: Thought content does not include homicidal or suicidal ideation.        Cognition and Memory: Cognition normal.        Judgment: Judgment normal.         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 Insomnia due to other mental disorder Resume Seroquel.  Patient is working on applying for [employer] financial  assistance.  [facility]  Patient strongly encouraged to contact [facility] for substance abuse treatment, patient currently does not have health insurance.  Patient is appreciative of recommendation.  Red flags given for prompt reevaluation - QUEtiapine (SEROQUEL) 200 MG tablet; Take 1 tablet (200 mg total) by mouth at bedtime.  Dispense: 30 tablet; Refill: 1  2. GAD (generalized anxiety disorder) Continue current regimen, check of [place] controlled substance registry appropriate  - pregabalin (LYRICA) 100 MG capsule; Take 1 capsule (100 mg total) by mouth 3 (three) times daily.  Dispense: 90 capsule; Refill: 0  3. Moderate episode of recurrent major depressive disorder (HCC)   4. Severe benzodiazepine use disorder (HCC)   5. Essential hypertension Resume metoprolol.  Patient encouraged to check blood pressure at home, keep a written log, and have available for all office visits. - metoprolol tartrate (LOPRESSOR) 25 MG tablet; Take 1 tablet (25 mg total) by mouth 2 (two) times daily.  Dispense: 60 tablet; Refill: 1   I have reviewed the patient's medical history (PMH, PSH, Social History, Family History, Medications, and allergies) , and have been updated if relevant. I spent 30 minutes reviewing chart and  face to face time with patient.    Return if symptoms worsen or fail to improve.   Kasandra Knudsen Mayers, PA-C

## 2022-06-29 ENCOUNTER — Encounter: Payer: Self-pay | Admitting: Physician Assistant

## 2022-06-29 DIAGNOSIS — F411 Generalized anxiety disorder: Secondary | ICD-10-CM | POA: Insufficient documentation

## 2022-06-29 DIAGNOSIS — F5105 Insomnia due to other mental disorder: Secondary | ICD-10-CM | POA: Insufficient documentation

## 2022-06-29 DIAGNOSIS — F331 Major depressive disorder, recurrent, moderate: Secondary | ICD-10-CM | POA: Insufficient documentation

## 2022-06-29 NOTE — Patient Instructions (Signed)
I sent your medication refills to your pharmacy.  As we discussed I strongly encourage you to reach out to [facility] residential treatment center in [facility] for substance abuse treatment.  Please let us know if there is anything else we can do for you.  Roney Jaffe, PA-C Physician Assistant [facility] [email or web address]   Substance Use Disorder and Mental Illness Substance use disorder is a condition in which a person is dependent on a substance, such as drugs or alcohol. A mental illness is a condition that occurs when someone experiences changes in mood, behavior, or thinking. Sometimes, these two conditions can occur at the same time (co-occurring disorders) and may be diagnosed together (dual diagnosis). What is the relationship between substance use disorder and mental illness? Substance use disorder and mental illness can share symptoms and can have similar causes, such as exposure to stress or changes in brain chemicals. The risk for developing both of these conditions can be passed from parent to child (inherited). People with mental illnesses sometimes use drugs to try to relieve symptoms, and this can lead to substance use disorder. Substance use disorders occur more often in people who have depression, schizophrenia, anxiety, or personality disorders. Also, when some drugs are used regularly, they can cause people to have symptoms of mental illness. What are the signs or symptoms? Symptoms vary widely for substance use disorder and mental illness, especially because these conditions can be present at the same time. Signs of a substance use disorder Failure to meet responsibilities at home, work, or school. Using substances in risky situations, such as while driving or using machinery. Taking serious risks to get drugs or alcohol. Spending less time on activities or hobbies that used to be important. Changes in personality  or attitude for no reason, such as angry outbursts, symptoms of anxiety, or unusual giddiness. Trying to hide the amount of drugs or alcohol used. Increased substance use over time, or needing to use more of a substance to feel the same effects (developing a tolerance). Physical symptoms may include: Sudden weight loss or gain. Sleeping too much or too little. Uncontrolled trembling or shaking (tremors), slurred speech, or lack of coordination. Continuing to use alcohol or a drug even though using it has led to bad outcomes or consequences, such as losing a job or ending a relationship. Signs and symptoms of mental illness Withdrawing from friends and family, or sudden changes in social behaviors or hobbies. Aggression toward people and animals. Repeatedly breaking serious rules or breaking the law. Persistent feelings of sadness, hopelessness, or thoughts of suicide. Having persistent thoughts or urges that are unpleasant or feel out of control (involuntary). The person may feel the need to act on the urges in order to reduce anxiety. False beliefs (delusions). Seeing, hearing, tasting, smelling, or feeling things that are not real (hallucinations). Other signs include: Sleeping too much or too little. Weight loss or weight gain. Being easily distracted. Extreme mood changes (mood swings). Confused thinking or trouble concentrating. How is this diagnosed? Substance use disorder and mental illnesses can be difficult to diagnose at the same time because of how varied and complex the symptoms are. Because these conditions can interact, one condition may be missed. This is why it is important to be completely honest with your health care provider about substance use and your other symptoms. Diagnosing your condition may include: A physical exam. A review of your medical history and your symptoms. Your health care provider may refer you  to a mental health professional for a psychiatric  evaluation. This may include assessments of: Your use of substances. Your risk of suicide. Your risk of aggressive behaviors. Your lifestyle, environment, and social situations. Your medical health. Your mental health and behavioral history. How is this treated? It is best to treat substance use disorder and mental illness at the same time (integrated treatment approach). Treatment usually involves more than one of the following methods: Detox. This refers to stopping substance abuse while being monitored by trained medical staff. This is usually the first step in treatment. Detox can last for up to 7 days. Rehabilitation. This involves staying in a treatment center where you can have medical and mental health support all the time. Medicines to relieve symptoms of mental illness and to control symptoms that are caused by stopping substance abuse (withdrawal symptoms). Support groups. These groups encourage you to talk about your fears, frustrations, and anxieties with others who have the same condition. Talk therapy. This is one-on-one therapy that can help you learn about your illness and learn ways to cope with symptoms or side effects. Follow these instructions at home: Medicines Take over-the-counter and prescription medicines only as told by your health care provider. Do not stop taking medicines unless you ask your health care provider if it is safe to do that. Tell your health care provider about any medicine side effects that you experience. Lifestyle Exercise regularly. Aim for 150 minutes of moderate exercise (such as walking or biking) or 75 minutes of vigorous exercise (such as running) each week. Eat a healthy diet with plenty of fruits and vegetables, whole grains, and lean proteins. Avoid caffeine and tobacco. These can worsen symptoms and anxiety. Do not drink alcohol or use drugs. Try to get 7-9 hours of sleep each night. To do this: Keep your bedroom cool and dark. Do not  eat a heavy meal during the hour before you go to bed. Do not have caffeine before bedtime. Avoid screen time during the few hours before bedtime. This means not watching TV and not using a computer, mobile phone, or tablet. General instructions Follow your treatment plan as directed. Work with your health care provider to adjust your treatment plan as needed. Attend support group or therapy sessions as directed. Explain your diagnosis to your friends and family. Let them know what your symptoms are and what things cause your symptoms to start (triggers). Avoid triggers or stressors that may worsen your symptoms or cause you to use alcohol or drugs. Spend time with family and friends who do not use substances. Make time to relax and do self-soothing activities, such as meditating or listening to music. Keep all follow-up visits. This is important. Where to find support You may find support for coping with substance use disorder and mental illness from: Your health care providers or your therapist. These providers can treat you or can help you find services to treat your condition. Local support groups for people with your condition. Your health care provider or therapist may be able to recommend a support group. This may be a hospital support group, a [employer] on Mental Illness (NAMI) support group, or a 12-step group such as Alcoholics Anonymous (AA) or Narcotics Anonymous (NA). Family and friends. Let them know what they can do to best support you through your recovery process. Where to find more information Substance Abuse and Mental Health Services Administration [facility]): Online: SkateOasis.com.pt National Helpline, to talk with a person who can help you find  information about treatment services in your area: 671-419-7592 810-657-2483) U.S. Department of Health and Health and safety inspector mental health services: https://www.vaughan-marshall.com/ National Alliance on Mental Illness (NAMI):  www.nami.org Contact a health care provider if: Your symptoms get worse or they do not get better. You have negative side effects from taking medicines. You want to discuss stopping medicines or treatment. You start using a substance again (have a relapse). Get help right away if: You have thoughts about harming yourself or others. If you ever feel like you may hurt yourself or others, or have thoughts about taking your own life, get help right away. Go to your nearest emergency department or: Call your local emergency services (911 in the [place] Call a suicide crisis helpline, such as the National Suicide Prevention Lifeline at (706)872-8023 or 988 in the [place] This is open 24 hours a day in the [place] Text the Crisis Text Line at (985)041-5447 (in the [place] Summary Substance use disorder and mental illness can occur at the same time (co-occurring disorders), and they may be diagnosed together (dual diagnosis). These conditions can be difficult to diagnose at the same time. It is important to be completely honest with your health care provider about your substance use and your other symptoms. It is best to treat substance use disorder and mental illness at the same time (integrated treatment approach). Identifying new ways to deal with triggers and difficult situations is an important part of recovery. Keep all follow-up visits. Attend support groups or treatment programs as directed. This information is not intended to replace advice given to you by your health care provider. Make sure you discuss any questions you have with your health care provider. Document Revised: 03/03/2021 Document Reviewed: 12/04/2020 Elsevier Patient Education  2023 [employer].

## 2022-07-12 ENCOUNTER — Ambulatory Visit: Payer: Medicaid Other | Attending: Family Medicine

## 2022-07-23 ENCOUNTER — Other Ambulatory Visit: Payer: Self-pay | Admitting: Family Medicine

## 2022-07-23 DIAGNOSIS — G43109 Migraine with aura, not intractable, without status migrainosus: Secondary | ICD-10-CM

## 2022-08-04 ENCOUNTER — Other Ambulatory Visit: Payer: Self-pay | Admitting: Physician Assistant

## 2022-08-04 DIAGNOSIS — F411 Generalized anxiety disorder: Secondary | ICD-10-CM

## 2022-08-23 ENCOUNTER — Other Ambulatory Visit: Payer: Self-pay | Admitting: Physician Assistant

## 2022-08-23 DIAGNOSIS — F5105 Insomnia due to other mental disorder: Secondary | ICD-10-CM

## 2022-09-14 ENCOUNTER — Ambulatory Visit: Payer: Medicaid Other | Attending: Family Medicine | Admitting: Family Medicine

## 2022-09-27 ENCOUNTER — Telehealth: Payer: Self-pay | Admitting: Family Medicine

## 2022-09-27 NOTE — Telephone Encounter (Addendum)
Patient's fiance called for patient he is requesting refills on Lisinopril 20mg  and Seroquel 200mg  be sent to [facility] in [facility]  he is scheduled to come in on 10-06-22.

## 2022-09-27 NOTE — Telephone Encounter (Signed)
Unfortunately if has been >1 year since he has been seen.  No show for last appt in 03/2022. Unable to fill until he is seen for appt.    Thanks!  CM

## 2022-09-28 ENCOUNTER — Ambulatory Visit: Payer: Self-pay | Admitting: Physician Assistant

## 2022-09-28 ENCOUNTER — Encounter: Payer: Self-pay | Admitting: Physician Assistant

## 2022-09-28 VITALS — BP 153/100 | HR 109 | Ht 74.0 in | Wt 282.0 lb

## 2022-09-28 DIAGNOSIS — F411 Generalized anxiety disorder: Secondary | ICD-10-CM

## 2022-09-28 DIAGNOSIS — I10 Essential (primary) hypertension: Secondary | ICD-10-CM

## 2022-09-28 DIAGNOSIS — F5105 Insomnia due to other mental disorder: Secondary | ICD-10-CM

## 2022-09-28 DIAGNOSIS — U070 Vaping-related disorder: Secondary | ICD-10-CM

## 2022-09-28 DIAGNOSIS — F99 Mental disorder, not otherwise specified: Secondary | ICD-10-CM

## 2022-09-28 MED ORDER — LISINOPRIL 20 MG PO TABS: 20.0000 mg | ORAL_TABLET | Freq: Every day | ORAL | 1 refills | Status: DC

## 2022-09-28 MED ORDER — QUETIAPINE FUMARATE 200 MG PO TABS: 200.0000 mg | ORAL_TABLET | Freq: Every day | ORAL | 1 refills | Status: DC

## 2022-09-28 NOTE — Patient Instructions (Signed)
Please let us know if there is anything else we can do for you.  Kennieth Rad, PA-C Physician Assistant [place] [email or web address]   How to Take Your Blood Pressure Blood pressure is a measurement of how strongly your blood is pressing against the walls of your arteries. Arteries are blood vessels that carry blood from your heart throughout your body. Your health care provider takes your blood pressure at each office visit. You can also take your own blood pressure at home with a blood pressure monitor. You may need to take your own blood pressure to: Confirm a diagnosis of high blood pressure (hypertension). Monitor your blood pressure over time. Make sure your blood pressure medicine is working. Supplies needed: Blood pressure monitor. A chair to sit in. This should be a chair where you can sit upright with your back supported. Do not sit on a soft couch or an armchair. Table or desk. Small notebook and pencil or pen. How to prepare To get the most accurate reading, avoid the following for 30 minutes before you check your blood pressure: Drinking caffeine. Drinking alcohol. Eating. Smoking. Exercising. Five minutes before you check your blood pressure: Use the bathroom and urinate so that you have an empty bladder. Sit quietly in a chair. Do not talk. How to take your blood pressure To check your blood pressure, follow the instructions in the manual that came with your blood pressure monitor. If you have a digital blood pressure monitor, the instructions may be as follows: Sit up straight in a chair. Place your feet on the floor. Do not cross your ankles or legs. Rest your left arm at the level of your heart on a table or desk or on the arm of a chair. Pull up your shirt sleeve. Wrap the blood pressure cuff around the upper part of your left arm, 1 inch (2.5 cm) above your elbow. It is best to wrap the cuff around bare  skin. Fit the cuff snugly, but not too tightly, around your arm. You should be able to place only one finger between the cuff and your arm. Position the cord so that it rests in the bend of your elbow. Press the power button. Sit quietly while the cuff inflates and deflates. Read the digital reading on the monitor screen and write the numbers down (record them) in a notebook. Wait 2-3 minutes, then repeat the steps, starting at step 1. What does my blood pressure reading mean? A blood pressure reading consists of a higher number over a lower number. Ideally, your blood pressure should be below 120/80. The first ("top") number is called the systolic pressure. It is a measure of the pressure in your arteries as your heart beats. The second ("bottom") number is called the diastolic pressure. It is a measure of the pressure in your arteries as the heart relaxes. Blood pressure is classified into four stages. The following are the stages for adults who do not have a short-term serious illness or a chronic condition. Systolic pressure and diastolic pressure are measured in a unit called mm Hg (millimeters of mercury).  Normal Systolic pressure: below 025. Diastolic pressure: below 80. Elevated Systolic pressure: 852-778. Diastolic pressure: below 80. Hypertension stage 1 Systolic pressure: 242-353. Diastolic pressure: 61-44. Hypertension stage 2 Systolic pressure: 315 or above. Diastolic pressure: 90 or above. You can have elevated blood pressure or hypertension even if only the systolic or only the diastolic number in your reading is higher than normal. Follow  these instructions at home: Medicines Take over-the-counter and prescription medicines only as told by your health care provider. Tell your health care provider if you are having any side effects from blood pressure medicine. General instructions Check your blood pressure as often as recommended by your health care provider. Check your  blood pressure at the same time every day. Take your monitor to the next appointment with your health care provider to make sure that: You are using it correctly. It provides accurate readings. Understand what your goal blood pressure numbers are. Keep all follow-up visits. This is important. General tips Your health care provider can suggest a reliable monitor that will meet your needs. There are several types of home blood pressure monitors. Choose a monitor that has an arm cuff. Do not choose a monitor that measures your blood pressure from your wrist or finger. Choose a cuff that wraps snugly, not too tight or too loose, around your upper arm. You should be able to fit only one finger between your arm and the cuff. You can buy a blood pressure monitor at most drugstores or online. Where to find more information American Heart Association: www.heart.org Contact a health care provider if: Your blood pressure is consistently high. Your blood pressure is suddenly low. Get help right away if: Your systolic blood pressure is higher than 180. Your diastolic blood pressure is higher than 120. These symptoms may be an emergency. Get help right away. Call 911. Do not wait to see if the symptoms will go away. Do not drive yourself to the hospital. Summary Blood pressure is a measurement of how strongly your blood is pressing against the walls of your arteries. A blood pressure reading consists of a higher number over a lower number. Ideally, your blood pressure should be below 120/80. Check your blood pressure at the same time every day. Avoid caffeine, alcohol, smoking, and exercise for 30 minutes prior to checking your blood pressure. These agents can affect the accuracy of the blood pressure reading. This information is not intended to replace advice given to you by your health care provider. Make sure you discuss any questions you have with your health care provider. Document Revised:  04/22/2021 Document Reviewed: 04/22/2021 Elsevier Patient Education  [employer].

## 2022-09-28 NOTE — Progress Notes (Signed)
Established Patient Office Visit  Subjective   Patient ID: Scott Holland, male    DOB: [birth year]  Age: 32 y.o. MRN: [id number]  Chief Complaint  Patient presents with   Medication Refill    Patient states has decided to stop a few of his medications, he Bp x1 month  Bp Medication and Protonix     States that he has been out of his medications for the past 2 weeks.  States that he has been checking his blood pressure at home, states that he has had elevated blood pressure readings, similar to today's.  States that when he has his blood pressure medication his blood pressure readings have been within normal limits.  States that he has been working on sobriety and has "been clean off everything for 110 days"  States that he stopped taking the Lyrica and Lexapro on his own, states that he did not notice much difference when he was taking those medications.  States that he does still have elevated anxiety, states that he has been working on lifestyle modifications such as exercise with mild relief.  States that he has been having difficulty sleeping both falling asleep and staying asleep since being out of his Seroquel.  States that Seroquel does offer relief.     Past Medical History:  Diagnosis Date   Anxiety    Asthma    Depression    Drug use    Hypertension    Social History   Socioeconomic History   Marital status: Significant Other    Spouse name: Not on file   Number of children: Not on file   Years of education: Not on file   Highest education level: Not on file  Occupational History   Occupation: Server  Tobacco Use   Smoking status: Former    Packs/day: 1.00    Years: 3.00    Total pack years: 3.00    Types: Cigarettes    Quit date: 08/22/2017    Years since quitting: 5.1   Smokeless tobacco: Never  Vaping Use   Vaping Use: Every day  Substance and Sexual Activity   Alcohol use: Not Currently    Alcohol/week: 1.0 - 2.0 standard drink of alcohol     Types: 1 - 2 Standard drinks or equivalent per week   Drug use: Yes    Types: Marijuana    Comment: daily   Sexual activity: Yes    Partners: Female    Birth control/protection: None  Other Topics Concern   Not on file  Social History Narrative   Not on file   Social Determinants of Health   Financial Resource Strain: Not on file  Food Insecurity: Not on file  Transportation Needs: Not on file  Physical Activity: Not on file  Stress: Not on file  Social Connections: Not on file  Intimate Partner Violence: Not on file   Family History  Problem Relation Age of Onset   Hypertension Mother    Anxiety disorder Mother    Hypertension Maternal Grandmother    Anxiety disorder Maternal Grandmother    Hypertension Maternal Uncle    Anxiety disorder Maternal Uncle    No Known Allergies  Review of Systems  Constitutional: Negative.   HENT: Negative.    Eyes: Negative.   Respiratory:  Negative for shortness of breath.   Cardiovascular:  Negative for chest pain.  Gastrointestinal: Negative.   Genitourinary: Negative.   Musculoskeletal: Negative.   Skin: Negative.   Neurological: Negative.   Endo/Heme/Allergies:  Negative.   Psychiatric/Behavioral:  Negative for depression. The patient is nervous/anxious and has insomnia.       Objective:     BP (!) 153/100   Pulse (!) 109   Ht 6\' 2"  (1.88 m)   Wt 282 lb (127.9 kg)   SpO2 97%   BMI 36.21 kg/m  BP Readings from Last 3 Encounters:  09/28/22 (!) 153/100  06/28/22 (!) 148/103  06/22/22 126/75   Wt Readings from Last 3 Encounters:  09/28/22 282 lb (127.9 kg)  06/28/22 269 lb (122 kg)  06/17/22 270 lb 1 oz (122.5 kg)      Physical Exam Vitals and nursing note reviewed.  Constitutional:      Appearance: Normal appearance.  HENT:     Head: Normocephalic and atraumatic.     Right Ear: External ear normal.     Left Ear: External ear normal.     Nose: Nose normal.     Mouth/Throat:     Mouth: Mucous membranes are  moist.     Pharynx: Oropharynx is clear.  Eyes:     Extraocular Movements: Extraocular movements intact.     Conjunctiva/sclera: Conjunctivae normal.     Pupils: Pupils are equal, round, and reactive to light.  Cardiovascular:     Rate and Rhythm: Normal rate and regular rhythm.     Pulses: Normal pulses.     Heart sounds: Normal heart sounds.  Pulmonary:     Effort: Pulmonary effort is normal.     Breath sounds: Normal breath sounds.  Musculoskeletal:        General: Normal range of motion.     Cervical back: Normal range of motion and neck supple.  Skin:    General: Skin is warm and dry.  Neurological:     General: No focal deficit present.     Mental Status: He is alert and oriented to person, place, and time.  Psychiatric:        Mood and Affect: Mood normal.        Behavior: Behavior normal.        Thought Content: Thought content normal.        Judgment: Judgment normal.       Assessment & Plan:   Problem List Items Addressed This Visit       Cardiovascular and Mediastinum   Essential hypertension - Primary   Relevant Medications   lisinopril (ZESTRIL) 20 MG tablet     Other   Insomnia due to other mental disorder   Relevant Medications   QUEtiapine (SEROQUEL) 200 MG tablet   GAD (generalized anxiety disorder)   Other Visit Diagnoses     Elevated blood pressure reading in office with diagnosis of hypertension       Relevant Medications   lisinopril (ZESTRIL) 20 MG tablet     1. Essential hypertension Continue current regimen.  Patient encouraged to keep upcoming appoint with primary care provider.  Patient education given on supportive care.  Red flags given for prompt reevaluation. - lisinopril (ZESTRIL) 20 MG tablet; Take 1 tablet (20 mg total) by mouth daily.  Dispense: 30 tablet; Refill: 1  2. GAD (generalized anxiety disorder) Patient education given on lifestyle modifications  3. Insomnia due to other mental disorder Continue current regimen -  QUEtiapine (SEROQUEL) 200 MG tablet; Take 1 tablet (200 mg total) by mouth at bedtime.  Dispense: 30 tablet; Refill: 1  4. Elevated blood pressure reading in office with diagnosis of hypertension    I  have reviewed the patient's medical history (PMH, PSH, Social History, Family History, Medications, and allergies) , and have been updated if relevant. I spent 30 minutes reviewing chart and  face to face time with patient.     Return if symptoms worsen or fail to improve.    Loraine Grip Mayers, PA-C

## 2022-10-06 ENCOUNTER — Encounter: Payer: Self-pay | Admitting: Family Medicine

## 2022-10-06 ENCOUNTER — Ambulatory Visit (INDEPENDENT_AMBULATORY_CARE_PROVIDER_SITE_OTHER): Payer: Self-pay | Admitting: Family Medicine

## 2022-10-06 VITALS — BP 129/84 | HR 110 | Ht 74.0 in | Wt 278.1 lb

## 2022-10-06 DIAGNOSIS — I10 Essential (primary) hypertension: Secondary | ICD-10-CM

## 2022-10-06 DIAGNOSIS — F411 Generalized anxiety disorder: Secondary | ICD-10-CM

## 2022-10-06 DIAGNOSIS — F410 Panic disorder [episodic paroxysmal anxiety] without agoraphobia: Secondary | ICD-10-CM

## 2022-10-06 DIAGNOSIS — F5105 Insomnia due to other mental disorder: Secondary | ICD-10-CM

## 2022-10-06 DIAGNOSIS — F99 Mental disorder, not otherwise specified: Secondary | ICD-10-CM

## 2022-10-06 DIAGNOSIS — G43109 Migraine with aura, not intractable, without status migrainosus: Secondary | ICD-10-CM

## 2022-10-06 MED ORDER — PANTOPRAZOLE SODIUM 40 MG PO TBEC: 40.0000 mg | DELAYED_RELEASE_TABLET | Freq: Two times a day (BID) | ORAL | 1 refills | Status: DC

## 2022-10-06 MED ORDER — PREGABALIN 75 MG PO CAPS: 75.0000 mg | ORAL_CAPSULE | Freq: Three times a day (TID) | ORAL | 5 refills | Status: DC

## 2022-10-06 MED ORDER — SUMATRIPTAN SUCCINATE 100 MG PO TABS: ORAL_TABLET | ORAL | 5 refills | Status: DC

## 2022-10-06 MED ORDER — LISINOPRIL 20 MG PO TABS: 20.0000 mg | ORAL_TABLET | Freq: Every day | ORAL | 1 refills | Status: DC

## 2022-10-06 MED ORDER — QUETIAPINE FUMARATE 200 MG PO TABS: 200.0000 mg | ORAL_TABLET | Freq: Every day | ORAL | 1 refills | Status: DC

## 2022-10-06 NOTE — Progress Notes (Signed)
Scott Holland - 32 y.o. male MRN [id number]  Date of birth: 20-Apr-1991  Subjective Chief Complaint  Patient presents with   Medication Refill    Med refills     HPI Scott Holland is a 32 year old male here today for follow-up visit.  He reports he is doing pretty well.  Has had some increased anxiety.  He did discontinue Lexapro as he felt that this was not effective.  He reiterated again that Ativan worked well for him previously.  He does have history of benzodiazepine use disorder.  Seroquel remains effective for sleep at night.  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  Vistaril made him too sleepy.  BuSpar was not effective.  Gabapentin was not very helpful.  He was on Lyrica at 1 point and believes this may have helped some.  He would be interested in restarting this.  Blood pressures remain controlled with lisinopril for management.  He denies side effects with medication.  He has not had chest pain, shortness of breath, palpitations, headaches or vision changes.  ROS:  A comprehensive ROS was completed and negative except as noted per HPI  No Known Allergies  Past Medical History:  Diagnosis Date   Anxiety    Asthma    Depression    Drug use    Hypertension     Past Surgical History:  Procedure Laterality Date   FRACTURE SURGERY      Social History   Socioeconomic History   Marital status: Significant Other    Spouse name: Not on file   Number of children: Not on file   Years of education: Not on file   Highest education level: Not on file  Occupational History   Occupation: Server  Tobacco Use   Smoking status: Former    Packs/day: 1.00    Years: 3.00    Total pack years: 3.00    Types: Cigarettes    Quit date: 08/22/2017    Years since quitting: 5.1   Smokeless tobacco: Never  Vaping Use   Vaping Use: Every day  Substance and Sexual Activity   Alcohol use: Not Currently    Alcohol/week: 1.0 - 2.0 standard drink of alcohol    Types: 1 - 2 Standard drinks or  equivalent per week   Drug use: Yes    Types: Marijuana    Comment: daily   Sexual activity: Yes    Partners: Female    Birth control/protection: None  Other Topics Concern   Not on file  Social History Narrative   Not on file   Social Determinants of Health   Financial Resource Strain: Not on file  Food Insecurity: Not on file  Transportation Needs: Not on file  Physical Activity: Not on file  Stress: Not on file  Social Connections: Not on file    Family History  Problem Relation Age of Onset   Hypertension Mother    Anxiety disorder Mother    Hypertension Maternal Grandmother    Anxiety disorder Maternal Grandmother    Hypertension Maternal Uncle    Anxiety disorder Maternal Uncle     Health Maintenance  Topic Date Due   COVID-19 Vaccine (1) 10/22/2022 (Originally 10/05/1991)   Hepatitis C Screening  11/14/2022 (Originally 04/03/2009)   HIV Screening  11/14/2022 (Originally 04/03/2006)   INFLUENZA VACCINE  11/20/2022 (Originally 03/22/2022)   HPV VACCINES  Aged Out   DTaP/Tdap/Td  Discontinued     ----------------------------------------------------------------------------------------------------------------------------------------------------------------------------------------------------------------- Physical Exam BP 129/84 (BP Location: Left Arm, Patient Position: Sitting, Cuff Size:  Large)   Pulse (!) 110   Ht 6' 2"$  (1.88 m)   Wt 278 lb 1.3 oz (126.1 kg)   SpO2 96%   BMI 35.70 kg/m   Physical Exam Constitutional:      Appearance: Normal appearance.  HENT:     Head: Normocephalic and atraumatic.  Eyes:     General: No scleral icterus. Cardiovascular:     Rate and Rhythm: Normal rate and regular rhythm.  Pulmonary:     Effort: Pulmonary effort is normal.     Breath sounds: Normal breath sounds.  Musculoskeletal:     Cervical back: Neck supple.  Psychiatric:        Mood and Affect: Mood normal.        Behavior: Behavior normal.      ------------------------------------------------------------------------------------------------------------------------------------------------------------------------------------------------------------------- Assessment and Plan  Essential hypertension Blood pressure well-controlled at this time.  Recommend continuation of lisinopril at current strength.  Update labs ordered.  Insomnia due to other mental disorder Continue Seroquel at current strength.  Generalized anxiety disorder with panic attacks History of several other medications in the past.  Discontinued Lexapro as he felt that this was effective.  Declined to provide benzodiazepine prescription based on his prior history of benzodiazepine use disorder.  Adding Lyrica 75 mg 3 times daily.   Meds ordered this encounter  Medications   lisinopril (ZESTRIL) 20 MG tablet    Sig: Take 1 tablet (20 mg total) by mouth daily.    Dispense:  90 tablet    Refill:  1   pantoprazole (PROTONIX) 40 MG tablet    Sig: Take 1 tablet (40 mg total) by mouth 2 (two) times daily.    Dispense:  90 tablet    Refill:  1   QUEtiapine (SEROQUEL) 200 MG tablet    Sig: Take 1 tablet (200 mg total) by mouth at bedtime.    Dispense:  90 tablet    Refill:  1   SUMAtriptan (IMITREX) 100 MG tablet    Sig: TAKE ONE TABLET BY MOUTH DAILY FOR MIGRAINE    Dispense:  12 tablet    Refill:  5   pregabalin (LYRICA) 75 MG capsule    Sig: Take 1 capsule (75 mg total) by mouth 3 (three) times daily.    Dispense:  90 capsule    Refill:  5    Return in about 6 months (around 04/06/2023) for HTN.    This visit occurred during the SARS-CoV-2 public health emergency.  Safety protocols were in place, including screening questions prior to the visit, additional usage of staff PPE, and extensive cleaning of exam room while observing appropriate contact time as indicated for disinfecting solutions.

## 2022-10-06 NOTE — Assessment & Plan Note (Signed)
Continue Seroquel at current strength.

## 2022-10-06 NOTE — Assessment & Plan Note (Signed)
History of several other medications in the past.  Discontinued Lexapro as he felt that this was effective.  Declined to provide benzodiazepine prescription based on his prior history of benzodiazepine use disorder.  Adding Lyrica 75 mg 3 times daily.

## 2022-10-06 NOTE — Assessment & Plan Note (Signed)
Blood pressure well-controlled at this time.  Recommend continuation of lisinopril at current strength.  Update labs ordered.

## 2022-10-07 LAB — CBC WITH DIFFERENTIAL/PLATELET
Absolute Monocytes: 447 cells/uL (ref 200–950)
Basophils Absolute: 52 cells/uL (ref 0–200)
Basophils Relative: 0.6 %
Eosinophils Absolute: 60 cells/uL (ref 15–500)
Eosinophils Relative: 0.7 %
HCT: 42.7 % (ref 38.5–50.0)
Hemoglobin: 14.8 g/dL (ref 13.2–17.1)
Lymphs Abs: 1600 cells/uL (ref 850–3900)
MCH: 31.4 pg (ref 27.0–33.0)
MCHC: 34.7 g/dL (ref 32.0–36.0)
MCV: 90.5 fL (ref 80.0–100.0)
MPV: 11.6 fL (ref 7.5–12.5)
Monocytes Relative: 5.2 %
Neutro Abs: 6441 cells/uL (ref 1500–7800)
Neutrophils Relative %: 74.9 %
Platelets: 274 10*3/uL (ref 140–400)
RBC: 4.72 10*6/uL (ref 4.20–5.80)
RDW: 12.8 % (ref 11.0–15.0)
Total Lymphocyte: 18.6 %
WBC: 8.6 10*3/uL (ref 3.8–10.8)

## 2022-10-07 LAB — COMPLETE METABOLIC PANEL WITH GFR
AG Ratio: 1.7 (calc) (ref 1.0–2.5)
ALT: 30 U/L (ref 9–46)
AST: 18 U/L (ref 10–40)
Albumin: 4.8 g/dL (ref 3.6–5.1)
Alkaline phosphatase (APISO): 46 U/L (ref 36–130)
BUN: 10 mg/dL (ref 7–25)
CO2: 23 mmol/L (ref 20–32)
Calcium: 9.8 mg/dL (ref 8.6–10.3)
Chloride: 104 mmol/L (ref 98–110)
Creat: 0.91 mg/dL (ref 0.60–1.26)
Globulin: 2.9 g/dL (calc) (ref 1.9–3.7)
Glucose, Bld: 109 mg/dL — ABNORMAL HIGH (ref 65–99)
Potassium: 4.6 mmol/L (ref 3.5–5.3)
Sodium: 139 mmol/L (ref 135–146)
Total Bilirubin: 0.4 mg/dL (ref 0.2–1.2)
Total Protein: 7.7 g/dL (ref 6.1–8.1)
eGFR: 116 mL/min/{1.73_m2} (ref >=60)

## 2022-10-10 ENCOUNTER — Encounter: Payer: Self-pay | Admitting: Family Medicine

## 2022-10-11 IMAGING — DX DG WRIST COMPLETE 3+V*L*
4 series · 4 of 4 positions shown · non-contrast
Comparison: None.

CLINICAL DATA: Radiocarpal joint space.

EXAM:
LEFT WRIST - COMPLETE 3+ VIEW

[wrist pa]
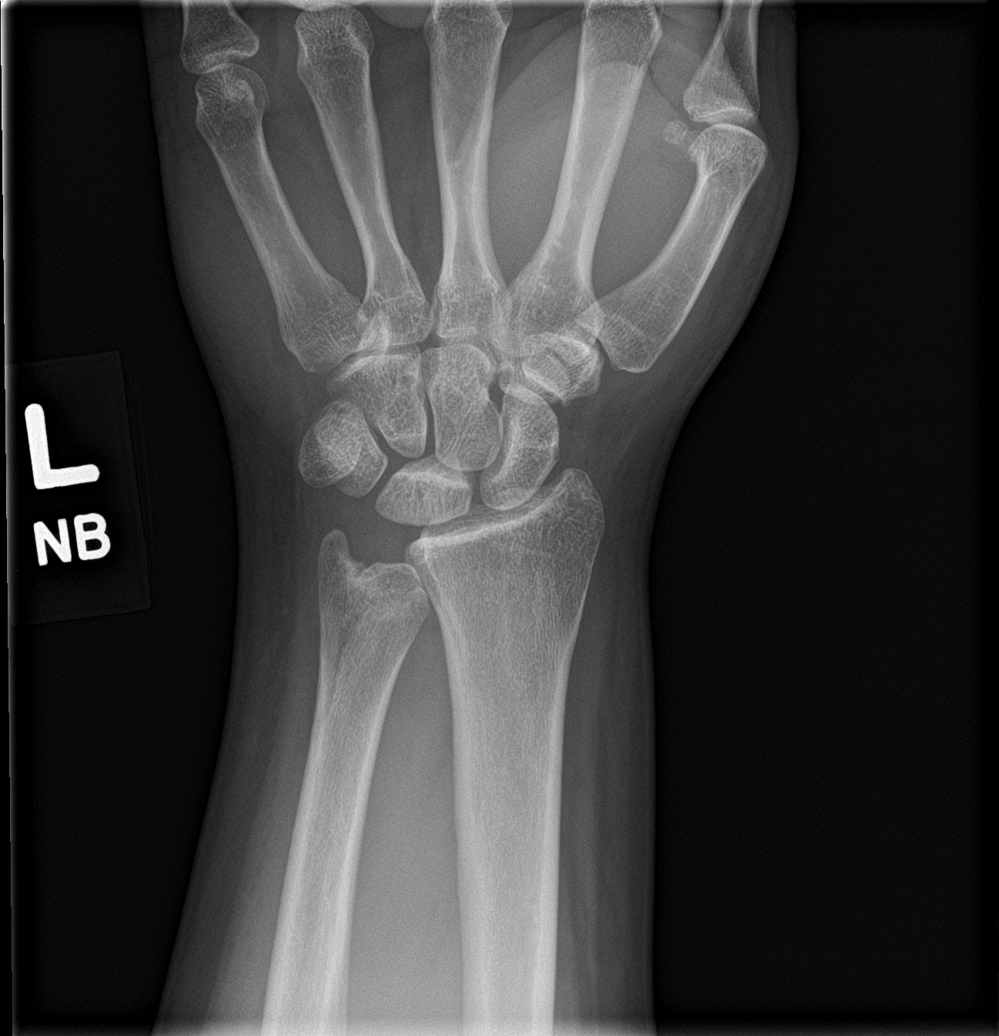

[wrist obl]
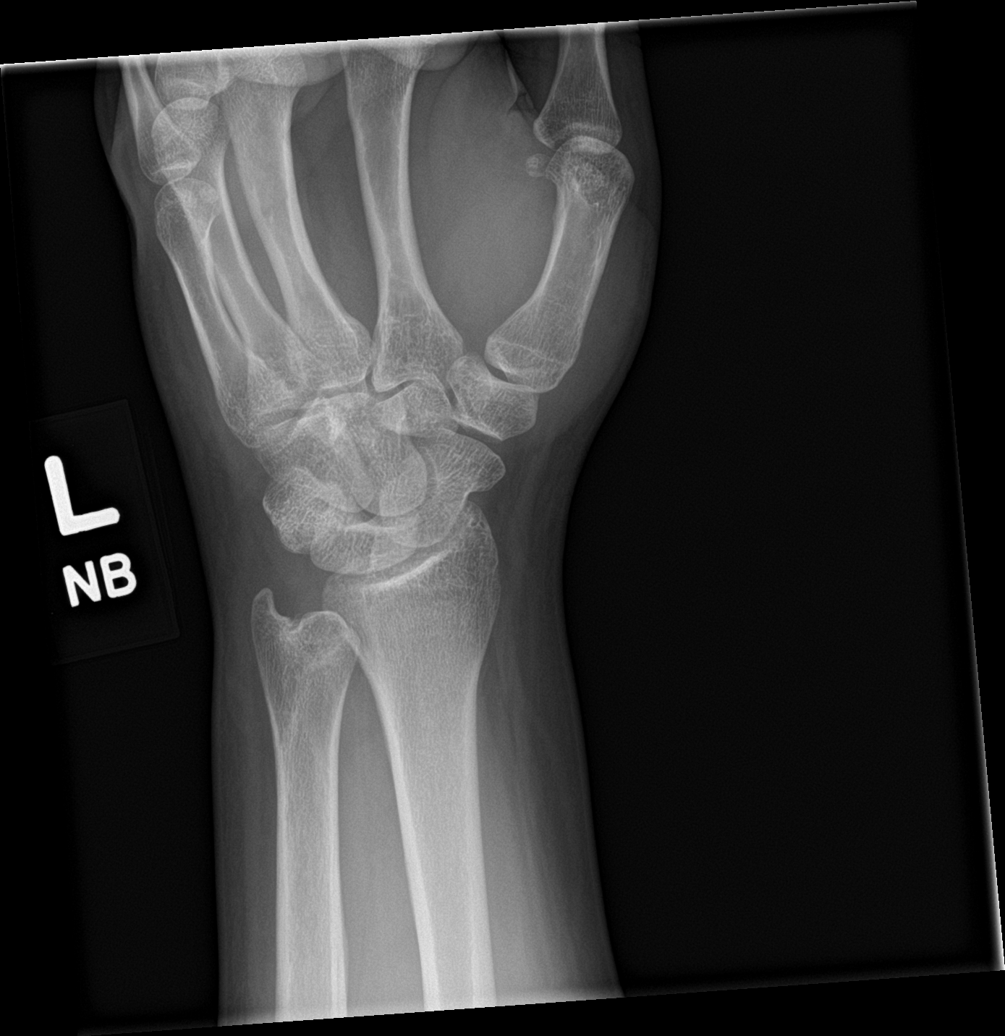

[wrist lat]
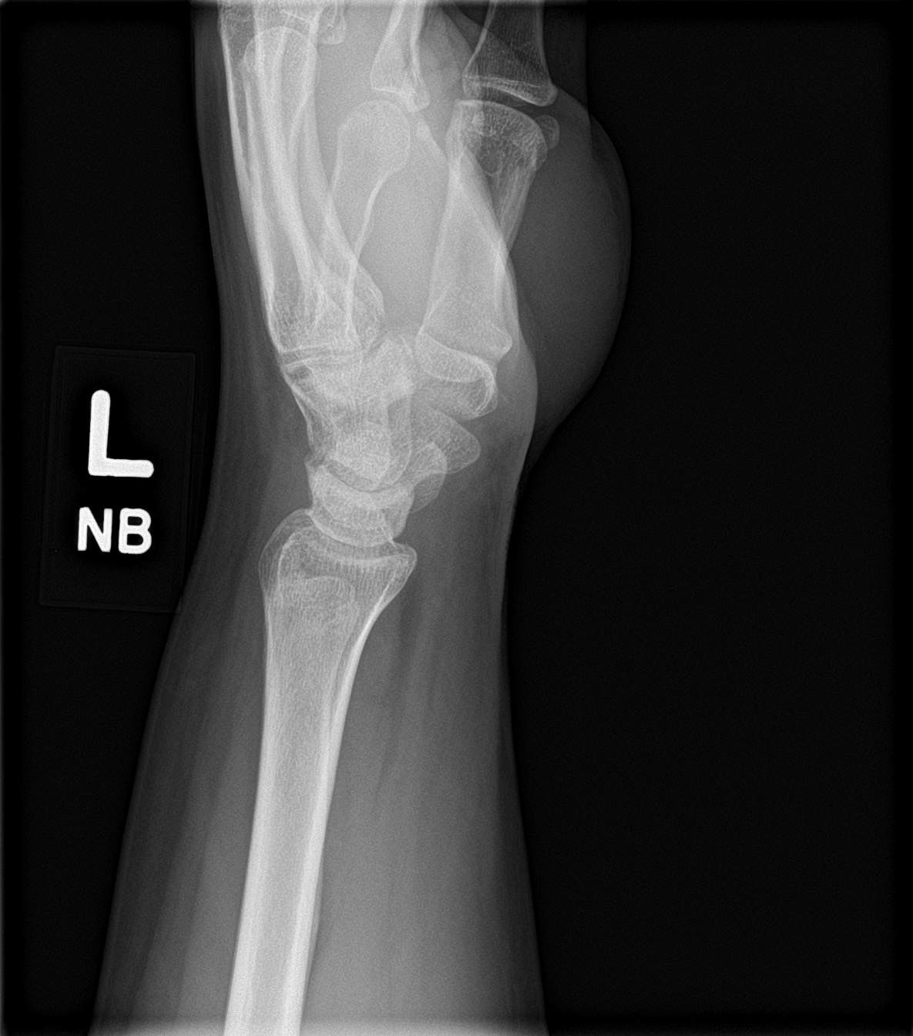

[wrist navicular]
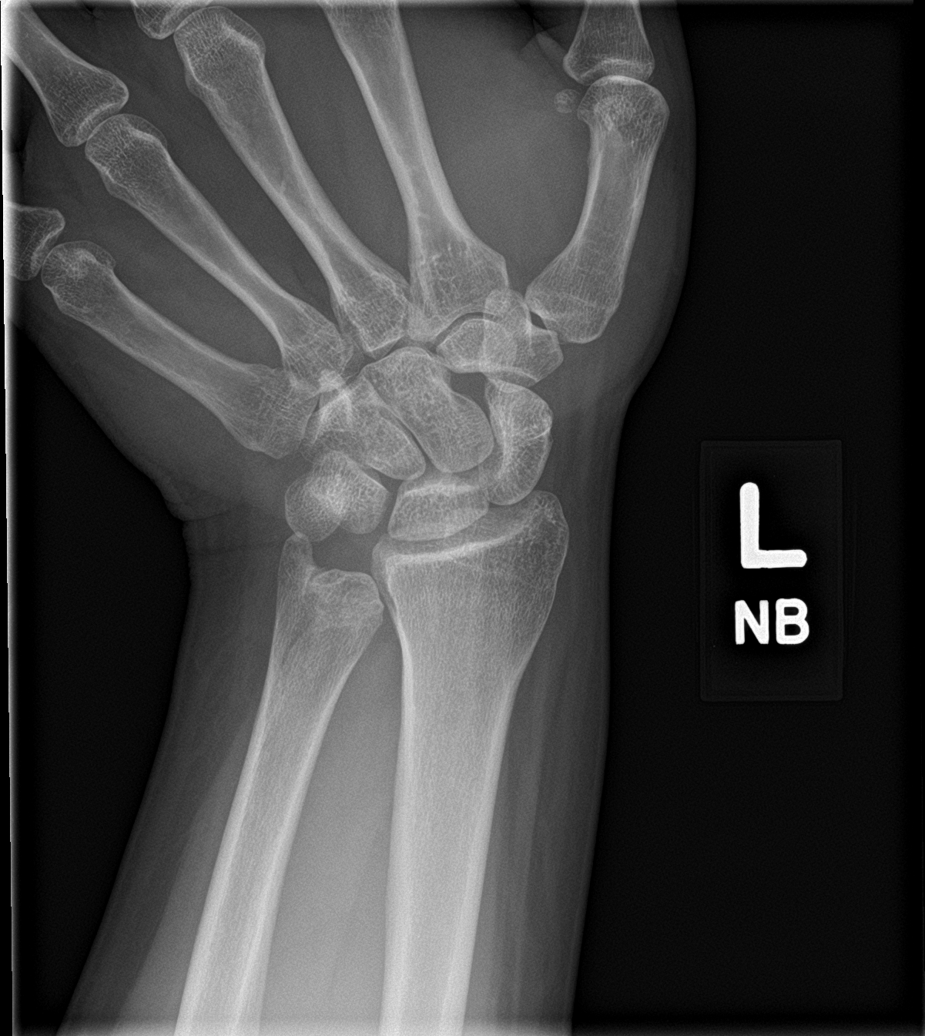

[4 of 4 positions shown; findings below may reference images not displayed]

FINDINGS: There is no acute fracture or dislocation identified. Joint spaces
are maintained. There is negative ulnar variance. Soft tissues are
within normal limits.
IMPRESSION: 1. No acute fracture or dislocation.
2. Negative ulnar variance.

## 2022-10-11 IMAGING — DX DG KNEE COMPLETE 4+V*R*
5 series · 5 of 5 positions shown · non-contrast
Comparison: None.

CLINICAL DATA: Lateral left knee swelling.  Fall.

EXAM:
RIGHT KNEE - COMPLETE 4+ VIEW; LEFT KNEE - 1-2 VIEW

[knee tunnel]
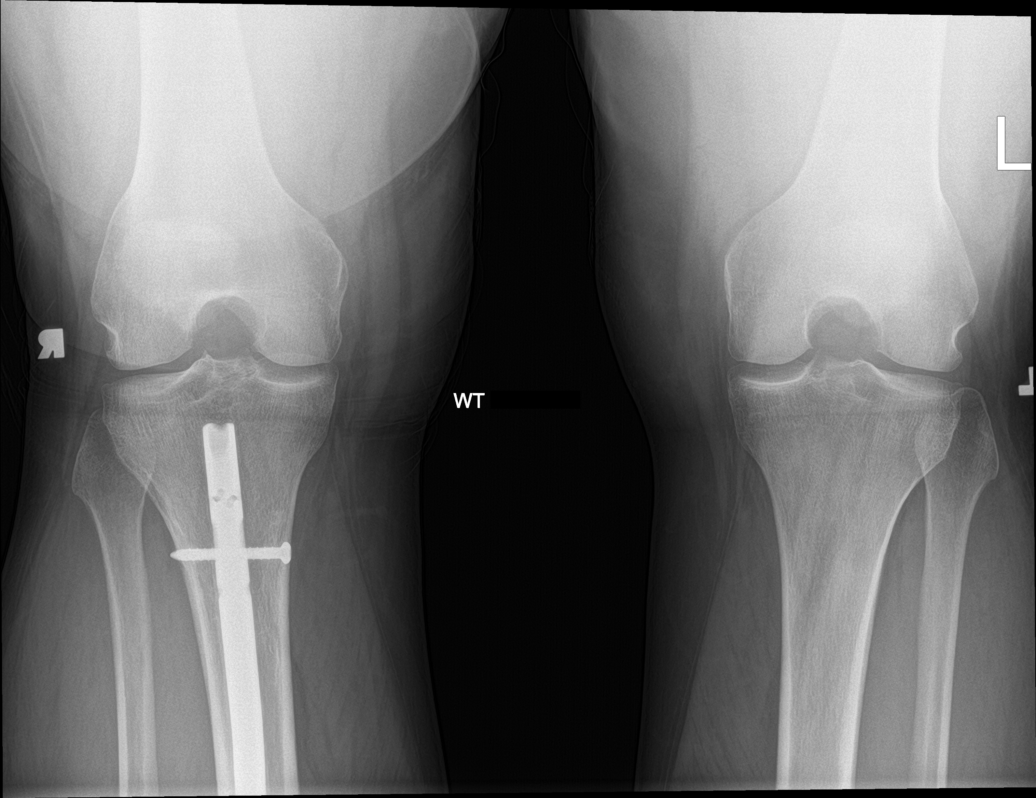

[knee lat]
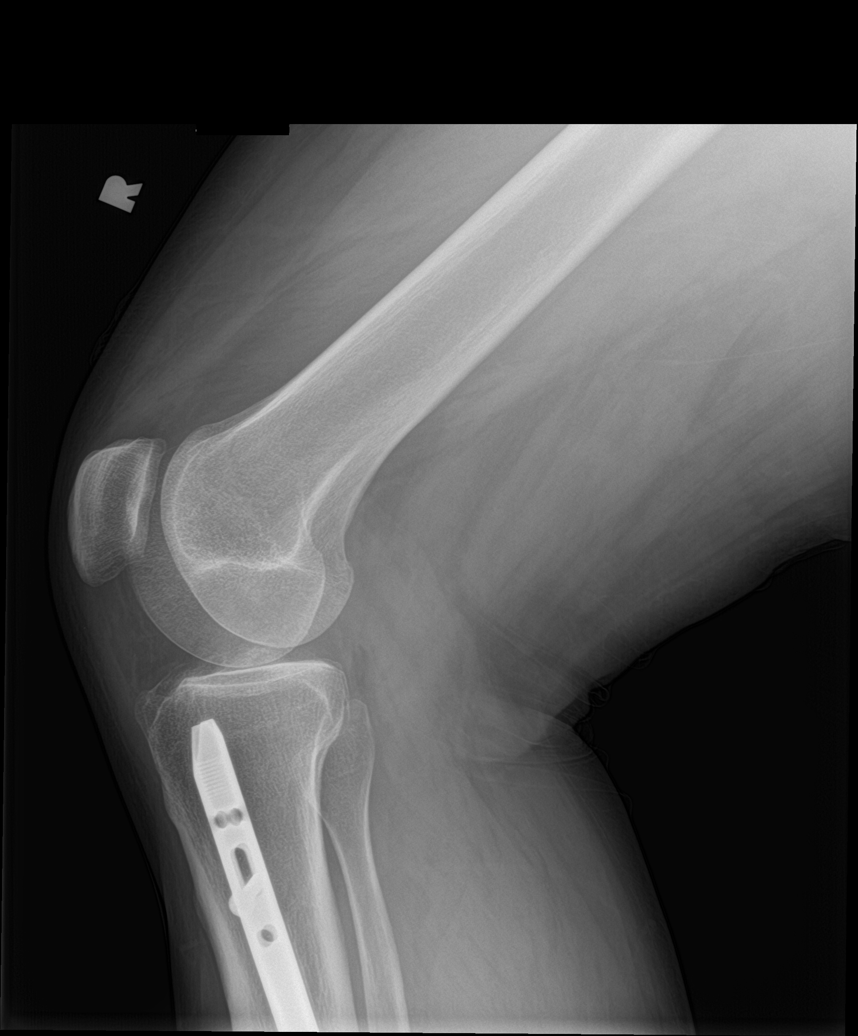

[knee ap bilat standing (1 of 2)]
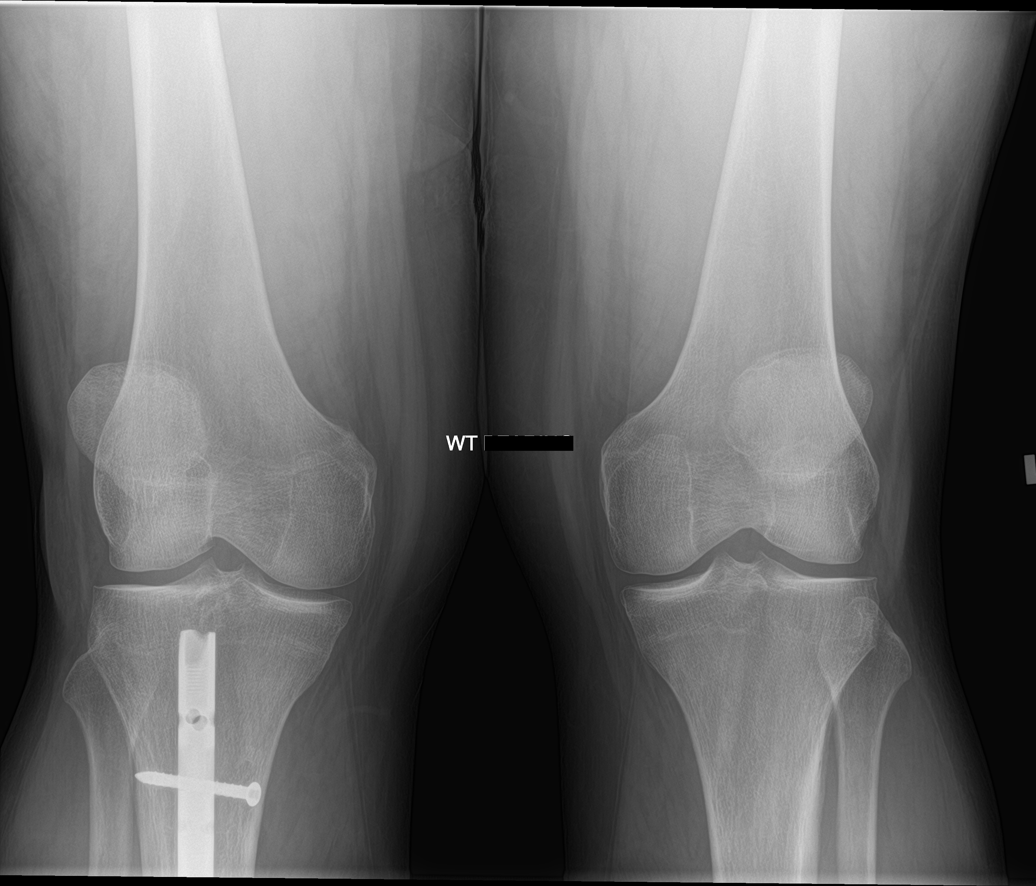

[knee sunrise standing]
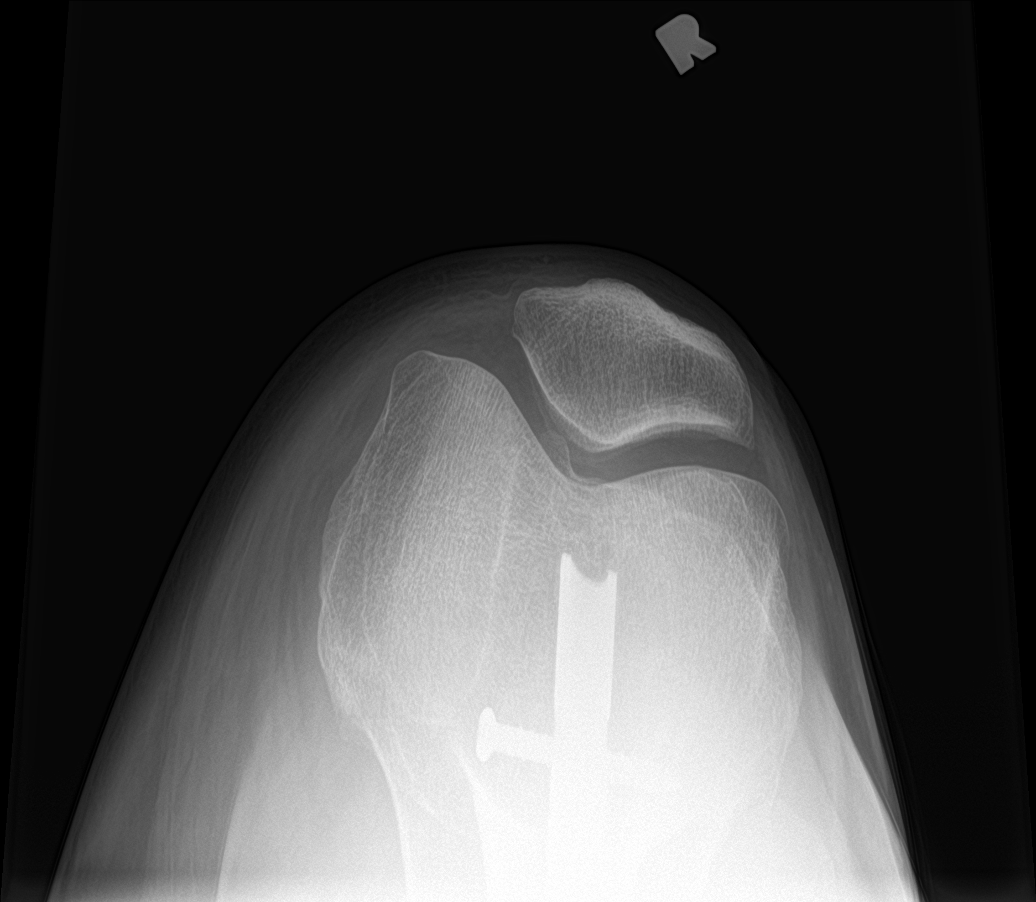

[knee ap bilat standing (2 of 2)]
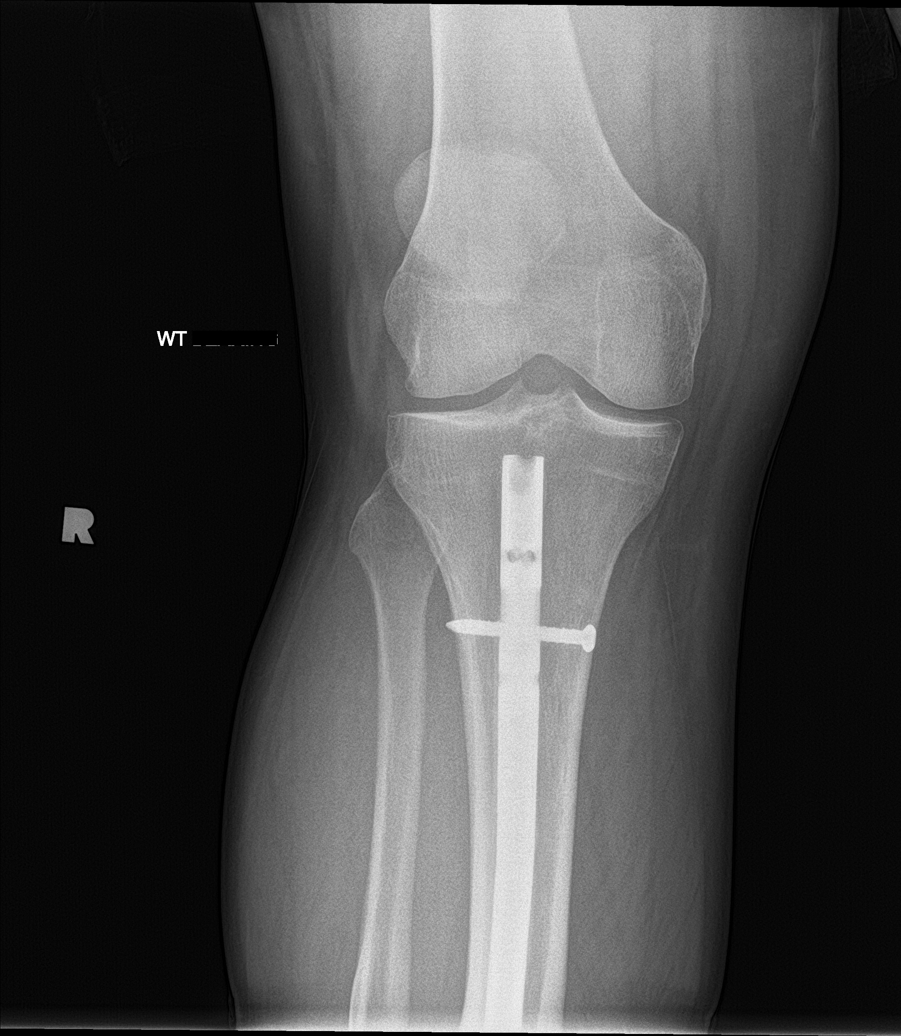

[5 of 5 positions shown; findings below may reference images not displayed]

FINDINGS: Right knee:

There is no evidence for acute fracture or dislocation. The joint
spaces are well maintained. Tibial intramedullary nail is partially
visualized. Soft tissues are within normal limits.

Left knee:

There is no acute fracture or dislocation. Joint spaces are
maintained. Soft tissues are within normal limits.
IMPRESSION: 1. No acute bony abnormality of the bilateral knees.

## 2022-10-24 ENCOUNTER — Encounter: Payer: Self-pay | Admitting: Family Medicine

## 2023-01-04 ENCOUNTER — Ambulatory Visit: Payer: Medicaid Other | Admitting: Family Medicine

## 2023-02-24 ENCOUNTER — Encounter: Payer: Self-pay | Admitting: Family Medicine

## 2023-02-24 ENCOUNTER — Ambulatory Visit (INDEPENDENT_AMBULATORY_CARE_PROVIDER_SITE_OTHER): Payer: Self-pay | Admitting: Family Medicine

## 2023-02-24 VITALS — BP 132/87 | HR 85 | Ht 74.0 in | Wt 264.0 lb

## 2023-02-24 DIAGNOSIS — M542 Cervicalgia: Secondary | ICD-10-CM

## 2023-02-24 DIAGNOSIS — G43109 Migraine with aura, not intractable, without status migrainosus: Secondary | ICD-10-CM

## 2023-02-24 DIAGNOSIS — I10 Essential (primary) hypertension: Secondary | ICD-10-CM

## 2023-02-24 DIAGNOSIS — G8929 Other chronic pain: Secondary | ICD-10-CM

## 2023-02-24 MED ORDER — PROMETHAZINE HCL 25 MG/ML IJ SOLN
25.0000 mg | Freq: Once | INTRAMUSCULAR | Status: AC
  Administered 2023-02-24: 25 mg via INTRAMUSCULAR

## 2023-02-24 MED ORDER — KETOROLAC TROMETHAMINE 60 MG/2ML IM SOLN
60.00 mg | Freq: Once | INTRAMUSCULAR | Status: AC
Start: 2023-02-24 — End: 2023-02-24
  Administered 2023-02-24: 60 mg via INTRAMUSCULAR

## 2023-02-24 MED ORDER — RIZATRIPTAN BENZOATE 10 MG PO TBDP: 10.0000 mg | ORAL_TABLET | ORAL | 2 refills | Status: DC | PRN

## 2023-02-24 MED ORDER — TOPIRAMATE 25 MG PO TABS: ORAL_TABLET | ORAL | 0 refills | Status: DC

## 2023-02-24 MED ORDER — CYCLOBENZAPRINE HCL 10 MG PO TABS: 10.0000 mg | ORAL_TABLET | Freq: Two times a day (BID) | ORAL | 0 refills | Status: DC | PRN

## 2023-02-24 NOTE — Progress Notes (Unsigned)
Acute Office Visit  Subjective:     Patient ID: Scott Holland, male    DOB: 08/25/1990, 32 y.o.   MRN: [id number]  Chief Complaint  Patient presents with   Migraine    HPI Patient is in today for acute migraine headache.  He was previously on metoprolol but the medication was switched to lisinopril several months ago.  Uses sumatriptan for rescue but has not used in almost a month.  He gets about 2 migraines on average per month and anywhere between 6 to 7 days/month he gets more mild headaches.  He often will use Excedrin or Tylenol.  He says he does get more relief with Tylenol than he does with ibuprofen or Aleve.  He has tried the Imitrex but it makes him more nauseated and has even vomited with it.  He tried a relatives Ozempic and says that he felt nauseated with that as well but not as much as he did with the Imitrex.  He feels like his neck and shoulders tend to be the trigger for his migraines.  Once he does get a migraine it usually last for about 3 days on average.  + nausea, vomiting, light sensitivity  ROS      Objective:    BP 132/87   Pulse 85   Ht 6\' 2"  (1.88 m)   Wt 264 lb (119.7 kg)   SpO2 98%   BMI 33.90 kg/m  {Vitals History (Optional):23777}  Physical Exam Vitals reviewed.  Constitutional:      Appearance: He is well-developed.  HENT:     Head: Normocephalic and atraumatic.  Eyes:     Conjunctiva/sclera: Conjunctivae normal.  Cardiovascular:     Rate and Rhythm: Normal rate.  Pulmonary:     Effort: Pulmonary effort is normal.  Skin:    General: Skin is dry.     Coloration: Skin is not pale.  Neurological:     Mental Status: He is alert and oriented to person, place, and time.  Psychiatric:        Behavior: Behavior normal.     No results found for any visits on 02/24/23.      Assessment & Plan:   Problem List Items Addressed This Visit       Cardiovascular and Mediastinum   Migraine with aura and without status migrainosus, not  intractable - Primary    He feels like his migraines have been much more frequent in the last 5 years.  We discussed several options.  It sounds like he is not doing great with the Imitrex it tends to make him more nauseated and sometimes even vomit after he takes it.  Will switch to Maxalt melts.  We also discussed the importance of taking it as soon as he thinks it is going to be a migraine to try to abort the headache and he can also repeat the dose within a couple of hours if he is not getting gotten complete relief.  We also discussed preventative measures and foods to avoid.  Recommend avoiding artificial sweeteners caffeine etc.  He says he is not quite ready to give up his caffeine but might be willing to at least cut back.  He also gets a lot of pain and tension in his shoulders with the migraines and has used Flexeril in the past and felt like it was helpful so we will send that to the pharmacy.  Beta-blockers evidently were not helpful as prophylaxis so we discussed the option of topiramate.  Will give that as try over the next couple of months I did put him on a starter dose he will need a new prescription in 1 month to continue with it did encourage him to keep a diary/log so he can better track of his migraines are improving or not.      Relevant Medications   topiramate (TOPAMAX) 25 MG tablet   rizatriptan (MAXALT-MLT) 10 MG disintegrating tablet   cyclobenzaprine (FLEXERIL) 10 MG tablet   Essential hypertension    Is also been a little inconsistent with his blood pressure medication recently and just encouraged him to take it every day.  Blood pressures is a little borderline elevated today but he is not feeling well and is in pain.        Other   Chronic neck pain   Relevant Medications   topiramate (TOPAMAX) 25 MG tablet   cyclobenzaprine (FLEXERIL) 10 MG tablet    Given injection of Toradol and promethazine here in the office for acute relief.  His significant other is driving  today.  Meds ordered this encounter  Medications   topiramate (TOPAMAX) 25 MG tablet    Sig: Take 1 tablet (25 mg total) by mouth at bedtime for 5 days, THEN 1 tablet (25 mg total) 2 (two) times daily for 10 days, THEN 2 tablets (50 mg total) 2 (two) times daily for 15 days.    Dispense:  85 tablet    Refill:  0   rizatriptan (MAXALT-MLT) 10 MG disintegrating tablet    Sig: Take 1 tablet (10 mg total) by mouth as needed for migraine. May repeat in 2 hours if needed    Dispense:  10 tablet    Refill:  2   cyclobenzaprine (FLEXERIL) 10 MG tablet    Sig: Take 1 tablet (10 mg total) by mouth 2 (two) times daily as needed for muscle spasms.    Dispense:  30 tablet    Refill:  0    Return in about 8 weeks (around 04/21/2023) for migraines.  Nani Gasser, MD  I spent 25 minutes on the day of the encounter to include pre-visit record review, face-to-face time with the patient and post visit ordering of test.

## 2023-02-24 NOTE — Assessment & Plan Note (Signed)
Is also been a little inconsistent with his blood pressure medication recently and just encouraged him to take it every day.  Blood pressures is a little borderline elevated today but he is not feeling well and is in pain.

## 2023-02-24 NOTE — Progress Notes (Unsigned)
Pt reports that he has had this headache for the past 3 days. He stated that his shoulders are really tight and it moves up into his neck.   He asked about getting a muscle relaxer. He had tried Robaxin in the past but this was ineffective and would like a prescription for flexeril

## 2023-02-24 NOTE — Assessment & Plan Note (Signed)
He feels like his migraines have been much more frequent in the last 5 years.  We discussed several options.  It sounds like he is not doing great with the Imitrex it tends to make him more nauseated and sometimes even vomit after he takes it.  Will switch to Maxalt melts.  We also discussed the importance of taking it as soon as he thinks it is going to be a migraine to try to abort the headache and he can also repeat the dose within a couple of hours if he is not getting gotten complete relief.  We also discussed preventative measures and foods to avoid.  Recommend avoiding artificial sweeteners caffeine etc.  He says he is not quite ready to give up his caffeine but might be willing to at least cut back.  He also gets a lot of pain and tension in his shoulders with the migraines and has used Flexeril in the past and felt like it was helpful so we will send that to the pharmacy.  Beta-blockers evidently were not helpful as prophylaxis so we discussed the option of topiramate.  Will give that as try over the next couple of months I did put him on a starter dose he will need a new prescription in 1 month to continue with it did encourage him to keep a diary/log so he can better track of his migraines are improving or not.

## 2023-02-28 ENCOUNTER — Encounter: Payer: Self-pay | Admitting: Family Medicine

## 2023-02-28 ENCOUNTER — Telehealth (INDEPENDENT_AMBULATORY_CARE_PROVIDER_SITE_OTHER): Payer: Self-pay | Admitting: Family Medicine

## 2023-02-28 VITALS — Ht 74.0 in | Wt 264.0 lb

## 2023-02-28 DIAGNOSIS — G43109 Migraine with aura, not intractable, without status migrainosus: Secondary | ICD-10-CM

## 2023-02-28 DIAGNOSIS — R41840 Attention and concentration deficit: Secondary | ICD-10-CM

## 2023-02-28 NOTE — Progress Notes (Signed)
Scott Holland - 32 y.o. male MRN [id number]  Date of birth: Dec 20, 1990   This visit type was conducted due to national recommendations for restrictions regarding the COVID-19 Pandemic (e.g. social distancing).  This format is felt to be most appropriate for this patient at this time.  All issues noted in this document were discussed and addressed.  No physical exam was performed (except for noted visual exam findings with Video Visits).  I discussed the limitations of evaluation and management by telemedicine and the availability of in person appointments. The patient expressed understanding and agreed to proceed.  I connected withNAME@ on 02/28/23 at 10:30 AM EDT by a video enabled telemedicine application and verified that I am speaking with the correct person using two identifiers.  Present at visit: Scott Coombe, DO Rontrell Havrilla   Patient Location: Home [address] [place] [address]   Provider location:   [facility]  Chief Complaint  Patient presents with   Migraine    HPI  Scott Holland is a 32 y.o. male who presents via audio/video conferencing for a telehealth visit today.  Following up for migraines.  Was on metoprolol but discontinued this as he did not feel like it was working very well for him.  He has noted increased migraines since discontinuing this.  He was seen by Dr. Linford Arnold last week and started on topiramate.  Additionally, Imitrex was changed to Maxalt melts.  Flexeril has seemed to help as well.  ROS:  A comprehensive ROS was completed and negative except as noted per HPI   Past Medical History:  Diagnosis Date   Anxiety    Asthma    Depression    Drug use    Hypertension     Past Surgical History:  Procedure Laterality Date   FRACTURE SURGERY      Family History  Problem Relation Age of Onset   Hypertension Mother    Anxiety disorder Mother    Hypertension Maternal Grandmother    Anxiety disorder Maternal Grandmother    Hypertension Maternal  Uncle    Anxiety disorder Maternal Uncle     Social History   Socioeconomic History   Marital status: Significant Other    Spouse name: Not on file   Number of children: Not on file   Years of education: Not on file   Highest education level: Not on file  Occupational History   Occupation: Server  Tobacco Use   Smoking status: Former    Packs/day: 1.00    Years: 3.00    Additional pack years: 0.00    Total pack years: 3.00    Types: Cigarettes    Quit date: 08/22/2017    Years since quitting: 5.5   Smokeless tobacco: Never  Vaping Use   Vaping Use: Every day  Substance and Sexual Activity   Alcohol use: Not Currently    Alcohol/week: 1.0 - 2.0 standard drink of alcohol    Types: 1 - 2 Standard drinks or equivalent per week   Drug use: Yes    Types: Marijuana    Comment: daily   Sexual activity: Yes    Partners: Female    Birth control/protection: None  Other Topics Concern   Not on file  Social History Narrative   Not on file   Social Determinants of Health   Financial Resource Strain: Not on file  Food Insecurity: Not on file  Transportation Needs: Not on file  Physical Activity: Not on file  Stress: Not on file  Social Connections: Not on file  Intimate Partner Violence: Not on file     Current Outpatient Medications:    albuterol (VENTOLIN HFA) 108 (90 Base) MCG/ACT inhaler, Inhale 2 puffs into the lungs every 4 (four) hours as needed for wheezing or shortness of breath., Disp: 18 g, Rfl: 3   cyclobenzaprine (FLEXERIL) 10 MG tablet, Take 1 tablet (10 mg total) by mouth 2 (two) times daily as needed for muscle spasms., Disp: 30 tablet, Rfl: 0   lisinopril (ZESTRIL) 20 MG tablet, Take 1 tablet (20 mg total) by mouth daily., Disp: 90 tablet, Rfl: 1   pantoprazole (PROTONIX) 40 MG tablet, Take 1 tablet (40 mg total) by mouth 2 (two) times daily., Disp: 90 tablet, Rfl: 1   pregabalin (LYRICA) 75 MG capsule, Take 1 capsule (75 mg total) by mouth 3 (three) times  daily., Disp: 90 capsule, Rfl: 5   QUEtiapine (SEROQUEL) 200 MG tablet, Take 1 tablet (200 mg total) by mouth at bedtime., Disp: 90 tablet, Rfl: 1   rizatriptan (MAXALT-MLT) 10 MG disintegrating tablet, Take 1 tablet (10 mg total) by mouth as needed for migraine. May repeat in 2 hours if needed, Disp: 10 tablet, Rfl: 2   topiramate (TOPAMAX) 25 MG tablet, Take 1 tablet (25 mg total) by mouth at bedtime for 5 days, THEN 1 tablet (25 mg total) 2 (two) times daily for 10 days, THEN 2 tablets (50 mg total) 2 (two) times daily for 15 days., Disp: 85 tablet, Rfl: 0  EXAM:  VITALS per patient if applicable: Ht 6\' 2"  (1.88 m)   Wt 264 lb (119.7 kg)   BMI 33.90 kg/m   GENERAL: alert, oriented, appears well and in no acute distress  HEENT: atraumatic, conjunttiva clear, no obvious abnormalities on inspection of external nose and ears  NECK: normal movements of the head and neck  LUNGS: on inspection no signs of respiratory distress, breathing rate appears normal, no obvious gross SOB, gasping or wheezing  CV: no obvious cyanosis  MS: moves all visible extremities without noticeable abnormality  PSYCH/NEURO: pleasant and cooperative, no obvious depression or anxiety, speech and thought processing grossly intact  ASSESSMENT AND PLAN:  Discussed the following assessment and plan:  Migraine with aura and without status migrainosus, not intractable Topiramate was added last week for migraine prevention and Imitrex changed to Maxalt.  This seems to be working well for him.  Recommend continuation at current strength.  Decreased attention Span He has noted decreased attention and thinks he may have ADD.  Referral placed to psychiatry for evaluation and potential treatment.     I discussed the assessment and treatment plan with the patient. The patient was provided an opportunity to ask questions and all were answered. The patient agreed with the plan and demonstrated an understanding of the  instructions.   The patient was advised to call back or seek an in-person evaluation if the symptoms worsen or if the condition fails to improve as anticipated.    Scott Coombe, DO

## 2023-02-28 NOTE — Assessment & Plan Note (Signed)
He has noted decreased attention and thinks he may have ADD.  Referral placed to psychiatry for evaluation and potential treatment.

## 2023-02-28 NOTE — Assessment & Plan Note (Signed)
Topiramate was added last week for migraine prevention and Imitrex changed to Maxalt.  This seems to be working well for him.  Recommend continuation at current strength.

## 2023-02-28 NOTE — Telephone Encounter (Signed)
Patient scheduled.

## 2023-03-23 ENCOUNTER — Other Ambulatory Visit: Payer: Self-pay | Admitting: Family Medicine

## 2023-03-23 DIAGNOSIS — G43109 Migraine with aura, not intractable, without status migrainosus: Secondary | ICD-10-CM

## 2023-03-27 NOTE — Telephone Encounter (Signed)
Is the SIG/directions to stay the same for the rx? Please advise. Attempted to contact patient for dose verification. No answer, left a vm msg for patient to return a call back. Thanks

## 2023-03-30 ENCOUNTER — Other Ambulatory Visit: Payer: Self-pay

## 2023-03-30 DIAGNOSIS — G43109 Migraine with aura, not intractable, without status migrainosus: Secondary | ICD-10-CM

## 2023-03-30 MED ORDER — TOPIRAMATE 50 MG PO TABS
25.0000 mg | ORAL_TABLET | Freq: Two times a day (BID) | ORAL | 0 refills | Status: DC
Start: 2023-03-30 — End: 2023-11-22

## 2023-04-06 ENCOUNTER — Ambulatory Visit: Payer: Medicaid Other | Admitting: Family Medicine

## 2023-04-06 ENCOUNTER — Encounter: Payer: Self-pay | Admitting: Family Medicine

## 2023-04-06 NOTE — Progress Notes (Deleted)
   Acute Office Visit  Subjective:     Patient ID: Scott Holland, male    DOB: 08-28-1990, 32 y.o.   MRN: [id number]  No chief complaint on file.   HPI Patient is in today for migraines. He was started on topomax for prophylaxis and given maxalt for breakthrough migraines. He notes worsening migraines since being on topomax.  ROS      Objective:    There were no vitals taken for this visit.   Physical Exam  No results found for any visits on 04/06/23.      Assessment & Plan:   Problem List Items Addressed This Visit   None   No orders of the defined types were placed in this encounter.   No follow-ups on file.  Charlton Amor, DO

## 2023-04-06 NOTE — Telephone Encounter (Signed)
Patient scheduled for  04/10/23 @ 1:10 for My chart video visit.

## 2023-04-10 ENCOUNTER — Telehealth (INDEPENDENT_AMBULATORY_CARE_PROVIDER_SITE_OTHER): Payer: Self-pay | Admitting: Family Medicine

## 2023-04-10 DIAGNOSIS — G8929 Other chronic pain: Secondary | ICD-10-CM

## 2023-04-10 DIAGNOSIS — M542 Cervicalgia: Secondary | ICD-10-CM

## 2023-04-10 DIAGNOSIS — F411 Generalized anxiety disorder: Secondary | ICD-10-CM

## 2023-04-10 DIAGNOSIS — F410 Panic disorder [episodic paroxysmal anxiety] without agoraphobia: Secondary | ICD-10-CM

## 2023-04-10 DIAGNOSIS — M5412 Radiculopathy, cervical region: Secondary | ICD-10-CM

## 2023-04-10 DIAGNOSIS — I10 Essential (primary) hypertension: Secondary | ICD-10-CM

## 2023-04-10 DIAGNOSIS — G43109 Migraine with aura, not intractable, without status migrainosus: Secondary | ICD-10-CM

## 2023-04-10 MED ORDER — CYCLOBENZAPRINE HCL 10 MG PO TABS
10.0000 mg | ORAL_TABLET | Freq: Two times a day (BID) | ORAL | 0 refills | Status: DC | PRN
Start: 2023-04-10 — End: 2023-09-04

## 2023-04-10 MED ORDER — PREGABALIN 75 MG PO CAPS: 75.0000 mg | ORAL_CAPSULE | Freq: Three times a day (TID) | ORAL | 5 refills | Status: DC

## 2023-04-10 MED ORDER — PANTOPRAZOLE SODIUM 40 MG PO TBEC: 40.0000 mg | DELAYED_RELEASE_TABLET | Freq: Two times a day (BID) | ORAL | 1 refills | Status: DC

## 2023-04-10 MED ORDER — LISINOPRIL 20 MG PO TABS
20.0000 mg | ORAL_TABLET | Freq: Every day | ORAL | 1 refills | Status: DC
Start: 2023-04-10 — End: 2023-11-22

## 2023-04-10 NOTE — Progress Notes (Signed)
Scott Holland - 32 y.o. male MRN [id number]  Date of birth: [birth year]   This visit type was conducted due to national recommendations for restrictions regarding the COVID-19 Pandemic (e.g. social distancing).  This format is felt to be most appropriate for this patient at this time.  All issues noted in this document were discussed and addressed.  No physical exam was performed (except for noted visual exam findings with Video Visits).  I discussed the limitations of evaluation and management by telemedicine and the availability of in person appointments. The patient expressed understanding and agreed to proceed.  I connected withNAME@ on 04/16/23 at  1:10 PM EDT by a video enabled telemedicine application and verified that I am speaking with the correct person using two identifiers.  Present at visit: Everrett Coombe, DO [name]   Patient Location: Home [address] [place] [address]   Provider location:   [facility]  Chief Complaint  Patient presents with   Anxiety    Increase in anxiety - now symptoms severe. No suicidal thoughts  but occasional depression. Mostly anxiety more than depression.    Migraine    Switched migraine meds x 1 month ago and tension in shoulders.    HPI  Scott Holland is a 32 y.o. male who presents via Web designer for a telehealth visit today.  Notes that anxiety has increased over the past couple of months.  He notes that the tends to worry more that something bad is going to happen.  He remains on combination of sertraline and Seroquel.  Topiramate was added recently as well for increased frequency of migraines.  He feels that his increase in symptoms did not really start until adding this on.  He has self discontinued and feels like his symptoms have improved quite a bit.  He has a history of benzodiazepine dependency.  Remains on lisinopril for management of hypertension.  Reported blood pressures have been well-controlled.  Remains on  Lyrica for cervical radiculopathy.  He finds this to be helpful.   ROS:  A comprehensive ROS was completed and negative except as noted per HPI  Past Medical History:  Diagnosis Date   Anxiety    Asthma    Depression    Drug use    Hypertension     Past Surgical History:  Procedure Laterality Date   FRACTURE SURGERY      Family History  Problem Relation Age of Onset   Hypertension Mother    Anxiety disorder Mother    Hypertension Maternal Grandmother    Anxiety disorder Maternal Grandmother    Hypertension Maternal Uncle    Anxiety disorder Maternal Uncle     Social History   Socioeconomic History   Marital status: Significant Other    Spouse name: Not on file   Number of children: Not on file   Years of education: Not on file   Highest education level: Not on file  Occupational History   Occupation: Server  Tobacco Use   Smoking status: Former    Current packs/day: 0.00    Average packs/day: 1 pack/day for 3.0 years (3.0 ttl pk-yrs)    Types: Cigarettes    Start date: 08/22/2014    Quit date: 08/22/2017    Years since quitting: 5.6   Smokeless tobacco: Never  Vaping Use   Vaping status: Every Day  Substance and Sexual Activity   Alcohol use: Not Currently    Alcohol/week: 1.0 - 2.0 standard drink of alcohol    Types:  1 - 2 Standard drinks or equivalent per week   Drug use: Yes    Types: Marijuana    Comment: daily   Sexual activity: Yes    Partners: Female    Birth control/protection: None  Other Topics Concern   Not on file  Social History Narrative   Not on file   Social Determinants of Health   Financial Resource Strain: Not on file  Food Insecurity: Not on file  Transportation Needs: Not on file  Physical Activity: Not on file  Stress: Not on file  Social Connections: Not on file  Intimate Partner Violence: Not on file     Current Outpatient Medications:    albuterol (VENTOLIN HFA) 108 (90 Base) MCG/ACT inhaler, Inhale 2 puffs into the  lungs every 4 (four) hours as needed for wheezing or shortness of breath., Disp: 18 g, Rfl: 3   QUEtiapine (SEROQUEL) 200 MG tablet, Take 1 tablet (200 mg total) by mouth at bedtime., Disp: 90 tablet, Rfl: 1   rizatriptan (MAXALT-MLT) 10 MG disintegrating tablet, Take 1 tablet (10 mg total) by mouth as needed for migraine. May repeat in 2 hours if needed, Disp: 10 tablet, Rfl: 2   topiramate (TOPAMAX) 50 MG tablet, Take 0.5 tablets (25 mg total) by mouth 2 (two) times daily., Disp: 180 tablet, Rfl: 0   cyclobenzaprine (FLEXERIL) 10 MG tablet, Take 1 tablet (10 mg total) by mouth 2 (two) times daily as needed for muscle spasms., Disp: 30 tablet, Rfl: 0   lisinopril (ZESTRIL) 20 MG tablet, Take 1 tablet (20 mg total) by mouth daily., Disp: 90 tablet, Rfl: 1   pantoprazole (PROTONIX) 40 MG tablet, Take 1 tablet (40 mg total) by mouth 2 (two) times daily., Disp: 90 tablet, Rfl: 1   pregabalin (LYRICA) 75 MG capsule, Take 1 capsule (75 mg total) by mouth 3 (three) times daily., Disp: 90 capsule, Rfl: 5  EXAM:  VITALS per patient if applicable: There were no vitals taken for this visit.  GENERAL: alert, oriented, appears well and in no acute distress  HEENT: atraumatic, conjunttiva clear, no obvious abnormalities on inspection of external nose and ears  NECK: normal movements of the head and neck  LUNGS: on inspection no signs of respiratory distress, breathing rate appears normal, no obvious gross SOB, gasping or wheezing  CV: no obvious cyanosis  MS: moves all visible extremities without noticeable abnormality  PSYCH/NEURO: pleasant and cooperative, no obvious depression or anxiety, speech and thought processing grossly intact  ASSESSMENT AND PLAN:  Discussed the following assessment and plan:  Generalized anxiety disorder with panic attacks Increased anxiety which may be related to addition of topiramate recently.  He has discontinued this with improvement of symptoms.  Will continue  with sertraline and Seroquel at current strength.    Essential hypertension Doing well on lisinopril.  Recommend continuation.  Right cervical radiculopathy Continue Lyrica at current strength.  Migraine with aura and without status migrainosus, not intractable Some increased frequency of migraines with discontinuation of topiramate.  I did offer him a trial of Nurtec samples.  He will stop by here and pick these up.     I discussed the assessment and treatment plan with the patient. The patient was provided an opportunity to ask questions and all were answered. The patient agreed with the plan and demonstrated an understanding of the instructions.   The patient was advised to call back or seek an in-person evaluation if the symptoms worsen or if the condition fails to  improve as anticipated.    Everrett Coombe, DO

## 2023-04-16 ENCOUNTER — Encounter: Payer: Self-pay | Admitting: Family Medicine

## 2023-04-16 NOTE — Assessment & Plan Note (Signed)
Some increased frequency of migraines with discontinuation of topiramate.  I did offer him a trial of Nurtec samples.  He will stop by here and pick these up.

## 2023-04-16 NOTE — Assessment & Plan Note (Signed)
Continue Lyrica at current strength.

## 2023-04-16 NOTE — Assessment & Plan Note (Signed)
Increased anxiety which may be related to addition of topiramate recently.  He has discontinued this with improvement of symptoms.  Will continue with sertraline and Seroquel at current strength.

## 2023-04-16 NOTE — Assessment & Plan Note (Signed)
Doing well on lisinopril.  Recommend continuation.

## 2023-05-19 ENCOUNTER — Ambulatory Visit: Payer: Medicaid Other | Admitting: Family Medicine

## 2023-06-23 ENCOUNTER — Other Ambulatory Visit: Payer: Self-pay | Admitting: Family Medicine

## 2023-06-23 DIAGNOSIS — F5105 Insomnia due to other mental disorder: Secondary | ICD-10-CM

## 2023-08-21 ENCOUNTER — Other Ambulatory Visit: Payer: Self-pay | Admitting: Family Medicine

## 2023-08-21 ENCOUNTER — Encounter: Payer: Self-pay | Admitting: Family Medicine

## 2023-08-21 MED ORDER — PREGABALIN 75 MG PO CAPS: 75.0000 mg | ORAL_CAPSULE | Freq: Three times a day (TID) | ORAL | 0 refills | Status: DC

## 2023-09-04 ENCOUNTER — Other Ambulatory Visit: Payer: Self-pay | Admitting: Family Medicine

## 2023-09-04 DIAGNOSIS — G8929 Other chronic pain: Secondary | ICD-10-CM

## 2023-09-04 MED ORDER — CYCLOBENZAPRINE HCL 10 MG PO TABS: 10.0000 mg | ORAL_TABLET | Freq: Two times a day (BID) | ORAL | 0 refills | Status: DC | PRN

## 2023-09-04 NOTE — Telephone Encounter (Signed)
 Requesting rx rf of cyclobenzaprine 10mg   Last written 04/10/2023 Last OV 07/052024 Upcoming appt = none

## 2023-09-17 ENCOUNTER — Other Ambulatory Visit: Payer: Self-pay | Admitting: Family Medicine

## 2023-09-26 ENCOUNTER — Other Ambulatory Visit: Payer: Self-pay | Admitting: Physician Assistant

## 2023-09-26 ENCOUNTER — Other Ambulatory Visit: Payer: Self-pay | Admitting: Family Medicine

## 2023-09-26 ENCOUNTER — Encounter: Payer: Self-pay | Admitting: Family Medicine

## 2023-09-26 DIAGNOSIS — F5105 Insomnia due to other mental disorder: Secondary | ICD-10-CM

## 2023-09-27 NOTE — Telephone Encounter (Signed)
 [facility] removed from preferred medication list

## 2023-10-24 ENCOUNTER — Other Ambulatory Visit: Payer: Self-pay | Admitting: Family Medicine

## 2023-10-24 DIAGNOSIS — G43109 Migraine with aura, not intractable, without status migrainosus: Secondary | ICD-10-CM

## 2023-10-25 ENCOUNTER — Other Ambulatory Visit: Payer: Self-pay | Admitting: Family Medicine

## 2023-10-25 NOTE — Telephone Encounter (Signed)
 Pls contact the patient to schedule appt with Dr. Ashley Royalty. Last seen 02/24/2023. Unable to refill meds without scheduled appt. Thanks

## 2023-10-26 MED ORDER — PREGABALIN 75 MG PO CAPS: 75.0000 mg | ORAL_CAPSULE | Freq: Three times a day (TID) | ORAL | 0 refills | Status: DC

## 2023-11-21 ENCOUNTER — Other Ambulatory Visit: Payer: Self-pay | Admitting: Family Medicine

## 2023-11-22 ENCOUNTER — Ambulatory Visit (INDEPENDENT_AMBULATORY_CARE_PROVIDER_SITE_OTHER): Payer: Self-pay | Admitting: Family Medicine

## 2023-11-22 ENCOUNTER — Encounter: Payer: Self-pay | Admitting: Family Medicine

## 2023-11-22 VITALS — BP 128/84 | HR 84 | Ht 74.0 in | Wt 264.6 lb

## 2023-11-22 DIAGNOSIS — M5412 Radiculopathy, cervical region: Secondary | ICD-10-CM

## 2023-11-22 DIAGNOSIS — G43109 Migraine with aura, not intractable, without status migrainosus: Secondary | ICD-10-CM

## 2023-11-22 DIAGNOSIS — F99 Mental disorder, not otherwise specified: Secondary | ICD-10-CM

## 2023-11-22 DIAGNOSIS — S8991XS Unspecified injury of right lower leg, sequela: Secondary | ICD-10-CM

## 2023-11-22 DIAGNOSIS — F5105 Insomnia due to other mental disorder: Secondary | ICD-10-CM

## 2023-11-22 DIAGNOSIS — M542 Cervicalgia: Secondary | ICD-10-CM

## 2023-11-22 DIAGNOSIS — G8929 Other chronic pain: Secondary | ICD-10-CM

## 2023-11-22 DIAGNOSIS — I10 Essential (primary) hypertension: Secondary | ICD-10-CM

## 2023-11-22 MED ORDER — CYCLOBENZAPRINE HCL 10 MG PO TABS: 10.0000 mg | ORAL_TABLET | Freq: Two times a day (BID) | ORAL | 2 refills | Status: AC | PRN

## 2023-11-22 MED ORDER — TOPIRAMATE 50 MG PO TABS: 25.0000 mg | ORAL_TABLET | Freq: Two times a day (BID) | ORAL | 1 refills | Status: DC

## 2023-11-22 MED ORDER — LISINOPRIL 20 MG PO TABS: 20.0000 mg | ORAL_TABLET | Freq: Every day | ORAL | 1 refills | Status: AC

## 2023-11-22 MED ORDER — PREGABALIN 100 MG PO CAPS: 100.0000 mg | ORAL_CAPSULE | Freq: Three times a day (TID) | ORAL | 4 refills | Status: AC

## 2023-11-22 MED ORDER — QUETIAPINE FUMARATE 200 MG PO TABS: 200.0000 mg | ORAL_TABLET | Freq: Every day | ORAL | 1 refills | Status: DC

## 2023-11-22 NOTE — Progress Notes (Signed)
 Scott Holland - 33 y.o. male MRN [id number]  Date of birth: June 05, 1991  Subjective Chief Complaint  Patient presents with   Medical Management of Chronic Issues    HPI Scott Holland is a 33 year old male here today for follow-up.  Reports overall he is doing pretty well.  Would like to go back up to 100 mg 3 times daily on Lyrica as he felt like this worked better for him.  He has not noted any significant side effects from this.  Continues on Flexeril as needed as well  Blood pressure mains fairly well-controlled with lisinopril at 20 mg daily.  Denies chest pain, shortness of breath, palpitations, headaches or vision changes.  Seroquel continues to work pretty well for sleep.  Having some increased headaches.  Recently restarted topiramate.  He has been using Maxalt more frequently.  ROS:  A comprehensive ROS was completed and negative except as noted per HPI   No Known Allergies  Past Medical History:  Diagnosis Date   Anxiety    Asthma    Depression    Drug use    Hypertension     Past Surgical History:  Procedure Laterality Date   FRACTURE SURGERY      Social History   Socioeconomic History   Marital status: Married    Spouse name: Not on file   Number of children: Not on file   Years of education: Not on file   Highest education level: Not on file  Occupational History   Occupation: Server  Tobacco Use   Smoking status: Former    Current packs/day: 0.00    Average packs/day: 1 pack/day for 3.0 years (3.0 ttl pk-yrs)    Types: Cigarettes    Start date: 08/22/2014    Quit date: 08/22/2017    Years since quitting: 6.2   Smokeless tobacco: Never  Vaping Use   Vaping status: Every Day  Substance and Sexual Activity   Alcohol use: Not Currently    Alcohol/week: 1.0 - 2.0 standard drink of alcohol    Types: 1 - 2 Standard drinks or equivalent per week   Drug use: Yes    Types: Marijuana    Comment: daily   Sexual activity: Yes    Partners: Female     Birth control/protection: None  Other Topics Concern   Not on file  Social History Narrative   Not on file   Social Drivers of Health   Financial Resource Strain: Not on file  Food Insecurity: Not on file  Transportation Needs: Not on file  Physical Activity: Not on file  Stress: Not on file  Social Connections: Not on file    Family History  Problem Relation Age of Onset   Hypertension Mother    Anxiety disorder Mother    Hypertension Maternal Grandmother    Anxiety disorder Maternal Grandmother    Hypertension Maternal Uncle    Anxiety disorder Maternal Uncle     Health Maintenance  Topic Date Due   HIV Screening  Never done   Hepatitis C Screening  Never done   COVID-19 Vaccine (1 - 2024-25 season) Never done   INFLUENZA VACCINE  03/22/2024   HPV VACCINES  Aged Out   DTaP/Tdap/Td  Discontinued     ----------------------------------------------------------------------------------------------------------------------------------------------------------------------------------------------------------------- Physical Exam BP 128/84 (BP Location: Left Arm, Patient Position: Sitting, Cuff Size: Large)   Pulse 84   Ht 6\' 2"  (1.88 m)   Wt 264 lb 9.6 oz (120 kg)   SpO2 100%   BMI 33.97  kg/m   Physical Exam Constitutional:      Appearance: Normal appearance.  HENT:     Head: Normocephalic and atraumatic.  Eyes:     General: No scleral icterus. Cardiovascular:     Rate and Rhythm: Normal rate and regular rhythm.  Pulmonary:     Effort: Pulmonary effort is normal.     Breath sounds: Normal breath sounds.  Neurological:     Mental Status: He is alert.  Psychiatric:        Mood and Affect: Mood normal.        Behavior: Behavior normal.     ------------------------------------------------------------------------------------------------------------------------------------------------------------------------------------------------------------------- Assessment  and Plan  Migraine with aura and without status migrainosus, not intractable He has been off of topiramate.  He has had an increase in migraines.  He recently restarted this.  Continue Maxalt as needed.  Right cervical radiculopathy Remains on Lyrica which is helpful for management of his radicular symptoms as well as his anxiety.  Will plan to continue this at current strength.  Essential hypertension Doing well on lisinopril.  Recommend continuation.  Insomnia due to other mental disorder Continue Seroquel at current strength.   Meds ordered this encounter  Medications   pregabalin (LYRICA) 100 MG capsule    Sig: Take 1 capsule (100 mg total) by mouth 3 (three) times daily.    Dispense:  90 capsule    Refill:  4   QUEtiapine (SEROQUEL) 200 MG tablet    Sig: Take 1 tablet (200 mg total) by mouth at bedtime.    Dispense:  90 tablet    Refill:  1   topiramate (TOPAMAX) 50 MG tablet    Sig: Take 0.5 tablets (25 mg total) by mouth 2 (two) times daily.    Dispense:  180 tablet    Refill:  1   lisinopril (ZESTRIL) 20 MG tablet    Sig: Take 1 tablet (20 mg total) by mouth daily.    Dispense:  90 tablet    Refill:  1   cyclobenzaprine (FLEXERIL) 10 MG tablet    Sig: Take 1 tablet (10 mg total) by mouth 2 (two) times daily as needed for muscle spasms.    Dispense:  60 tablet    Refill:  2    No follow-ups on file.    This visit occurred during the SARS-CoV-2 public health emergency.  Safety protocols were in place, including screening questions prior to the visit, additional usage of staff PPE, and extensive cleaning of exam room while observing appropriate contact time as indicated for disinfecting solutions.

## 2023-11-22 NOTE — Assessment & Plan Note (Signed)
 Remains on Lyrica which is helpful for management of his radicular symptoms as well as his anxiety.  Will plan to continue this at current strength.

## 2023-11-22 NOTE — Assessment & Plan Note (Signed)
 He has been off of topiramate.  He has had an increase in migraines.  He recently restarted this.  Continue Maxalt as needed.

## 2023-11-22 NOTE — Assessment & Plan Note (Signed)
Doing well on lisinopril.  Recommend continuation.

## 2023-11-22 NOTE — Assessment & Plan Note (Signed)
Continue Seroquel at current strength. 

## 2023-11-23 LAB — CBC WITH DIFFERENTIAL/PLATELET
Basophils Absolute: 0.1 10*3/uL (ref 0.0–0.2)
Basos: 1 %
EOS (ABSOLUTE): 0.3 10*3/uL (ref 0.0–0.4)
Eos: 4 %
Hematocrit: 39.2 % (ref 37.5–51.0)
Hemoglobin: 13.3 g/dL (ref 13.0–17.7)
Immature Grans (Abs): 0 10*3/uL (ref 0.0–0.1)
Immature Granulocytes: 0 %
Lymphocytes Absolute: 2.3 10*3/uL (ref 0.7–3.1)
Lymphs: 31 %
MCH: 31 pg (ref 26.6–33.0)
MCHC: 33.9 g/dL (ref 31.5–35.7)
MCV: 91 fL (ref 79–97)
Monocytes Absolute: 0.5 10*3/uL (ref 0.1–0.9)
Monocytes: 7 %
Neutrophils Absolute: 4.2 10*3/uL (ref 1.4–7.0)
Neutrophils: 57 %
Platelets: 254 10*3/uL (ref 150–450)
RBC: 4.29 x10E6/uL (ref 4.14–5.80)
RDW: 13.1 % (ref 11.6–15.4)
WBC: 7.4 10*3/uL (ref 3.4–10.8)

## 2023-11-23 LAB — CMP14+EGFR
ALT: 17 IU/L (ref 0–44)
AST: 17 IU/L (ref 0–40)
Albumin: 4.5 g/dL (ref 4.1–5.1)
Alkaline Phosphatase: 56 IU/L (ref 44–121)
BUN/Creatinine Ratio: 9 (ref 9–20)
BUN: 8 mg/dL (ref 6–20)
Bilirubin Total: 0.2 mg/dL (ref 0.0–1.2)
CO2: 23 mmol/L (ref 20–29)
Calcium: 9.2 mg/dL (ref 8.7–10.2)
Chloride: 104 mmol/L (ref 96–106)
Creatinine, Ser: 0.91 mg/dL (ref 0.76–1.27)
Globulin, Total: 2.2 g/dL (ref 1.5–4.5)
Glucose: 97 mg/dL (ref 70–99)
Potassium: 4.5 mmol/L (ref 3.5–5.2)
Sodium: 139 mmol/L (ref 134–144)
Total Protein: 6.7 g/dL (ref 6.0–8.5)
eGFR: 115 mL/min/{1.73_m2} (ref >59)

## 2023-11-27 ENCOUNTER — Ambulatory Visit
Admission: RE | Admit: 2023-11-27 | Discharge: 2023-11-27 | Disposition: A | Discharge status: Home / Self Care | Payer: Self-pay | Source: Ambulatory Visit | Attending: Family Medicine | Admitting: Family Medicine

## 2023-11-27 ENCOUNTER — Ambulatory Visit (INDEPENDENT_AMBULATORY_CARE_PROVIDER_SITE_OTHER): Payer: Self-pay | Admitting: Family Medicine

## 2023-11-27 VITALS — BP 138/84 | Ht 73.0 in | Wt 264.0 lb

## 2023-11-27 DIAGNOSIS — M79662 Pain in left lower leg: Secondary | ICD-10-CM

## 2023-11-27 MED ORDER — METHYLPREDNISOLONE ACETATE 40 MG/ML IJ SUSP
40.0000 mg | Freq: Once | INTRAMUSCULAR | Status: AC
  Administered 2023-11-27: 40 mg via INTRA_ARTICULAR

## 2023-11-27 NOTE — Progress Notes (Signed)
 PCP: Norton Blizzard, MD  Subjective:   HPI: Patient is a 33 y.o. male here for right lateral knee pain that originally started approximately two years ago. Initially, the pain gradually improved and was not bothersome until a few weeks ago, when it recurred. 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 He reports significant fluctuating swelling on the lateral aspect of the right knee. The patient has not had any imaging or steroid injections in the past year due to insurance issues. Previous treatments, including Voltaren gel, naproxen, and a knee brace, have provided only temporary relief.  Past Medical History:  Diagnosis Date   Anxiety    Asthma    Depression    Drug use    Hypertension     Current Outpatient Medications on File Prior to Visit  Medication Sig Dispense Refill   albuterol (VENTOLIN HFA) 108 (90 Base) MCG/ACT inhaler Inhale 2 puffs into the lungs every 4 (four) hours as needed for wheezing or shortness of breath. 18 g 3   cyclobenzaprine (FLEXERIL) 10 MG tablet Take 1 tablet (10 mg total) by mouth 2 (two) times daily as needed for muscle spasms. 60 tablet 2   lisinopril (ZESTRIL) 20 MG tablet Take 1 tablet (20 mg total) by mouth daily. 90 tablet 1   pantoprazole (PROTONIX) 40 MG tablet TAKE 1 TABLET BY MOUTH 2 TIMES A DAY 180 tablet 0   pregabalin (LYRICA) 100 MG capsule Take 1 capsule (100 mg total) by mouth 3 (three) times daily. 90 capsule 4   QUEtiapine (SEROQUEL) 200 MG tablet Take 1 tablet (200 mg total) by mouth at bedtime. 90 tablet 1   rizatriptan (MAXALT-MLT) 10 MG disintegrating tablet TAKE 1 TABLET BY MOUTH DAILY AS NEEDED FOR MIGRAINE. MAY REPEAT IN 2 HOURS IF NEEDED 10 tablet 2   topiramate (TOPAMAX) 50 MG tablet Take 0.5 tablets (25 mg total) by mouth 2 (two) times daily. 180 tablet 1   No current facility-administered medications on file prior to visit.    Past Surgical History:  Procedure Laterality Date   FRACTURE  SURGERY      No Known Allergies  BP 138/84   Ht 6\' 1"  (1.854 m)   Wt 264 lb (119.7 kg)   BMI 34.83 kg/m       No data to display              No data to display              Objective:  Physical Exam:  Gen: NAD, comfortable in exam room  MSK:   Right knee: No bruising or redness noted. Mild swelling over the lateral aspect of the right knee. Tender to palpation at the joint line. Range of motion (ROM) limited in right knee flexion; difficulty fully extending the knee. Strength is 5/5 in the right knee. Negative ant, post drawers.  Negative lachman.  Positive apley, mcmurrays, Thessaly test on the right knee.  Neuro: No loss of sensation in the right lower extremity.    Assessment & Plan:  Patient is a 33 year old male with right lateral knee pain, gradually worsening over the past few weeks, likely due to a lateral meniscus tear. The pain has been accompanied by fluctuating swelling and tenderness at the joint line. The recent onset of symptoms is possibly linked to his new job as a Airline pilot. The patient has not had recent imaging, and the condition has been refractory to conservative measures.  Plan: -  Administered a steroid injection into the right knee for pain relief. - Obtain right knee X-ray for further evaluation. - Once insurance approval is obtained, an MRI of the right knee is recommended to evaluate the meniscus and other potential causes. - Follow-up appointment after MRI results are available. Continue conservative management with NSAIDs, knee brace, and rest until further imaging is completed.   After informed written consent timeout was performed, patient was seated on exam table. Right knee was prepped with alcohol swab and utilizing anterolateral approach, patient's right knee was injected intraarticularly with 3:1 lidocaine: depomedrol. Patient tolerated the procedure well without immediate complications.

## 2023-11-27 NOTE — Patient Instructions (Signed)
 You were given an injection into your right knee today. Once the insurance goes through call us and we will order an MRI of this knee. Get x-rays of your left tibia/fibula at your convenience.

## 2023-12-01 ENCOUNTER — Encounter: Payer: Self-pay | Admitting: Family Medicine

## 2024-01-09 ENCOUNTER — Ambulatory Visit (INDEPENDENT_AMBULATORY_CARE_PROVIDER_SITE_OTHER): Payer: Self-pay

## 2024-01-09 ENCOUNTER — Encounter: Payer: Self-pay | Admitting: Family Medicine

## 2024-01-09 ENCOUNTER — Ambulatory Visit: Payer: Self-pay

## 2024-01-09 ENCOUNTER — Ambulatory Visit (INDEPENDENT_AMBULATORY_CARE_PROVIDER_SITE_OTHER): Payer: Self-pay | Admitting: Family Medicine

## 2024-01-09 VITALS — BP 127/88 | HR 95 | Ht 73.0 in | Wt 264.0 lb

## 2024-01-09 DIAGNOSIS — M79672 Pain in left foot: Secondary | ICD-10-CM

## 2024-01-09 DIAGNOSIS — M545 Low back pain, unspecified: Secondary | ICD-10-CM

## 2024-01-09 DIAGNOSIS — M79671 Pain in right foot: Secondary | ICD-10-CM

## 2024-01-09 DIAGNOSIS — M76829 Posterior tibial tendinitis, unspecified leg: Secondary | ICD-10-CM

## 2024-01-09 DIAGNOSIS — M549 Dorsalgia, unspecified: Secondary | ICD-10-CM | POA: Insufficient documentation

## 2024-01-09 DIAGNOSIS — M2142 Flat foot [pes planus] (acquired), left foot: Secondary | ICD-10-CM

## 2024-01-09 MED ORDER — PREDNISONE 20 MG PO TABS: 20.0000 mg | ORAL_TABLET | Freq: Two times a day (BID) | ORAL | 0 refills | Status: AC

## 2024-01-09 NOTE — Progress Notes (Signed)
 Scott Holland - 33 y.o. male MRN [id number]  Date of birth: [birth year]  Subjective Chief Complaint  Patient presents with   Foot Swelling    HPI Scott Holland is a 33 y.o. male here today with complaint of bilateral foot pain.  This started a few weeks ago.  He has a "knot" on the inside of both feet after standing or walking for prolonged period of time.  He has difficulty walking due to pain.  He does typically wear crocs while working.  He denies numbness or tingling.  He has been using ibuprofen  with only limited relief.  He is also having some pain in his lower back.  This waxes and wanes.  No radiation into lower extremities.  ROS:  A comprehensive ROS was completed and negative except as noted per HPI  No Known Allergies  Past Medical History:  Diagnosis Date   Anxiety    Asthma    Depression    Drug use    Hypertension     Past Surgical History:  Procedure Laterality Date   FRACTURE SURGERY      Social History   Socioeconomic History   Marital status: Married    Spouse name: Not on file   Number of children: Not on file   Years of education: Not on file   Highest education level: Not on file  Occupational History   Occupation: Server  Tobacco Use   Smoking status: Former    Current packs/day: 0.00    Average packs/day: 1 pack/day for 3.0 years (3.0 ttl pk-yrs)    Types: Cigarettes    Start date: 08/22/2014    Quit date: 08/22/2017    Years since quitting: 6.3   Smokeless tobacco: Never  Vaping Use   Vaping status: Every Day  Substance and Sexual Activity   Alcohol use: Not Currently    Alcohol/week: 1.0 - 2.0 standard drink of alcohol    Types: 1 - 2 Standard drinks or equivalent per week   Drug use: Yes    Types: Marijuana    Comment: daily   Sexual activity: Yes    Partners: Female    Birth control/protection: None  Other Topics Concern   Not on file  Social History Narrative   Not on file   Social Drivers of Health   Financial Resource  Strain: Not on file  Food Insecurity: Not on file  Transportation Needs: Not on file  Physical Activity: Not on file  Stress: Not on file  Social Connections: Not on file    Family History  Problem Relation Age of Onset   Hypertension Mother    Anxiety disorder Mother    Hypertension Maternal Grandmother    Anxiety disorder Maternal Grandmother    Hypertension Maternal Uncle    Anxiety disorder Maternal Uncle     Health Maintenance  Topic Date Due   HIV Screening  Never done   Hepatitis C Screening  Never done   COVID-19 Vaccine (1 - 2024-25 season) Never done   INFLUENZA VACCINE  03/22/2024   HPV VACCINES  Aged Out   Meningococcal B Vaccine  Aged Out   DTaP/Tdap/Td  Discontinued     ----------------------------------------------------------------------------------------------------------------------------------------------------------------------------------------------------------------- Physical Exam BP 127/88 (BP Location: Left Arm, Patient Position: Sitting, Cuff Size: Large)   Pulse 95   Ht 6\' 1"  (1.854 m)   Wt 264 lb (119.7 kg)   SpO2 99%   BMI 34.83 kg/m   Physical Exam Musculoskeletal:     Comments: Pes planus  on left foot.  He does have pronounced pronation on the left foot with tenderness along the left calcaneus and throughout the plantar fascia.  He also has mild loss of the arch on the right foot.  Tenderness along the insertion of the plantar fascia at the calcaneus as well as throughout the posterior calcaneus.  Psychiatric:        Mood and Affect: Mood normal.        Behavior: Behavior normal.     ------------------------------------------------------------------------------------------------------------------------------------------------------------------------------------------------------------------- Assessment and Plan  Bilateral foot pain He does have left-sided pes planus with bilateral plantar fasciitis.  Possible heel spur on the right  calcaneus.  X-rays ordered of bilateral feet.  Given handout for home exercises for posterior tibial dysfunction.  Referral to podiatry for evaluation for orthotics.  Sending in a prescription for prednisone .  Back pain Given handout for home exercise program for spine conditioning.   Meds ordered this encounter  Medications   predniSONE  (DELTASONE ) 20 MG tablet    Sig: Take 1 tablet (20 mg total) by mouth 2 (two) times daily with a meal for 5 days.    Dispense:  10 tablet    Refill:  0    No follow-ups on file.

## 2024-01-09 NOTE — Assessment & Plan Note (Signed)
 Given handout for home exercise program for spine conditioning.

## 2024-01-09 NOTE — Assessment & Plan Note (Signed)
 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  Possible heel spur on the right calcaneus.  X-rays ordered of bilateral feet.  Given handout for home exercises for posterior tibial dysfunction.  Referral to podiatry for evaluation for orthotics.  Sending in a prescription for prednisone .

## 2024-01-09 NOTE — Telephone Encounter (Signed)
 Copied from CRM [id number]. Topic: Clinical - Red Word Triage >> Jan 09, 2024  8:12 AM Scott Holland wrote: Red Word that prompted transfer to Nurse Triage: Really bad pain and know on right heel. Keep going from left foot to right foot.   Chief Complaint: Foot Pain Symptoms: knots on foot, pain worse in morning Frequency: constant Pertinent Negatives: Patient denies fall or injury Disposition: [] ED /[] Urgent Care (no appt availability in office) / [] Appointment(In office/virtual)/ []  [facility]/ [] Home Care/ [] Refused Recommended Disposition /[] [facility]/ []  Follow-up with PCP Additional Notes: Foot pain x 1 week, worse in morning, feels like knots on heels. Appt scheduled for today Reason for Disposition  [1] MODERATE pain (e.g., interferes with normal activities, limping) AND [2] present > 3 days  Answer Assessment - Initial Assessment Questions 1. ONSET: "When did the pain start?"      1 week  2. LOCATION: "Where is the pain located?"      Right Heel, near ball of foot. Knots on foot with pain shooting  3. PAIN: "How bad is the pain?"    (Scale 1-10; or mild, moderate, severe)  - MILD (1-3): doesn't interfere with normal activities.   - MODERATE (4-7): interferes with normal activities (e.g., work or school) or awakens from sleep, limping.   - SEVERE (8-10): excruciating pain, unable to do any normal activities, unable to walk.      Severe when foot touches ground and worse in morning  4. WORK OR EXERCISE: "Has there been any recent work or exercise that involved this part of the body?"      Works on feet all the time at work  5. CAUSE: "What do you think is causing the foot pain?"     Unsure of call  6. OTHER SYMPTOMS: "Do you have any other symptoms?" (e.g., leg pain, rash, fever, numbness)     Knot on foot  Protocols used: Foot Pain-[facility]-AH

## 2024-01-19 ENCOUNTER — Ambulatory Visit: Payer: Self-pay | Admitting: Family Medicine

## 2024-01-24 ENCOUNTER — Encounter: Payer: Self-pay | Admitting: Podiatry

## 2024-01-24 NOTE — Progress Notes (Signed)
Patient did not show for scheduled appointment today.

## 2024-02-22 ENCOUNTER — Other Ambulatory Visit: Payer: Self-pay | Admitting: Family Medicine

## 2024-03-19 ENCOUNTER — Ambulatory Visit: Payer: Self-pay

## 2024-03-19 ENCOUNTER — Telehealth: Payer: Self-pay | Admitting: Physician Assistant

## 2024-03-19 DIAGNOSIS — J329 Chronic sinusitis, unspecified: Secondary | ICD-10-CM

## 2024-03-19 MED ORDER — AMOXICILLIN-POT CLAVULANATE 875-125 MG PO TABS: 1.0000 | ORAL_TABLET | Freq: Two times a day (BID) | ORAL | 0 refills | Status: AC

## 2024-03-19 NOTE — Telephone Encounter (Signed)
 FYI Only or Action Required?: FYI only for provider.  Patient was last seen in primary care on 01/09/2024 by Alvia Bring, DO.  Called Nurse Triage reporting Sinusitis.  Symptoms began several weeks ago.  Interventions attempted: OTC medications: Muccinex, Dayquil, Nyquil, Tylenol  and Prescription medications: albuterol  inhaler.  Symptoms are: sinus pain/congestion, bright green nasal discharge, chest congestion, productive cough with bright green to brown sputum, bilateral earaches, chills, night sweats gradually worsening.  Triage Disposition: See Physician Within 24 Hours  Patient/caregiver understands and will follow disposition?: Yes           Copied from CRM #[id number]. Topic: Clinical - Red Word Triage >> Mar 19, 2024  3:22 PM Cherylann RAMAN wrote: Red Word that prompted transfer to Nurse Triage: Lum, wife, called and stated that patient has a cold that has turned into a sinus infection. Patient has bright mucus, lightheadedness, shortness of breath, and sinus pressure headaches. Patient has taken OTC e.g mucinex , nyquil, day quil, and tylenol  with no relief but has gotten worse.   On Call: Lum, Wife PCP: Bring Alvia MKV Reason for Disposition  Earache  Answer Assessment - Initial Assessment Questions Wife, Lum, states this started as a head cold. During triage she states she is unavailable to speak and will transfer the call to the patient. Offered urgent care appt, patient states he has his daughter at home with him and would prefer virtual visit. No available appts with [facility] til Thursday, scheduled for virtual urgent care this evening.  1. LOCATION: Where does it hurt?      Bridge of nose, cheeks  2. ONSET: When did the sinus pain start?  (e.g., hours, days)      Going on 2 weeks, worsening.  3. SEVERITY: How bad is the pain?   (Scale 0-10; or none, mild, moderate or severe)     7/10.  4. RECURRENT SYMPTOM: Have you ever had sinus problems before?  If Yes, ask: When was the last time? and What happened that time?      Yes, he states he has had sinus infections before. Unsure of last time he had one.  5. NASAL CONGESTION: Is the nose blocked? If Yes, ask: Can you open it or must you breathe through your mouth?     Yes. He states right now he can breathe out of his nose with blowing his nose as much as he can.  6. NASAL DISCHARGE: Do you have discharge from your nose? If so ask, What color?     Yes, bright green.  7. FEVER: Do you have a fever? If Yes, ask: What is it, how was it measured, and when did it start?      They do not have a thermometer to check.  8. OTHER SYMPTOMS: Do you have any other symptoms? (e.g., sore throat, cough, earache, difficulty breathing)     Sneezing, productive cough with bright green to brown bloody mucus, night sweats, chills, earaches (bilateral), SOB improving (states he is using his inhaler)  9. PREGNANCY: Is there any chance you are pregnant? When was your last menstrual period?     N/A.  Protocols used: Sinus Pain or Congestion-A-AH

## 2024-03-19 NOTE — Progress Notes (Signed)
 Virtual Visit Consent   Scott Holland, you are scheduled for a virtual visit with a [facility] provider today. Just as with appointments in the office, your consent must be obtained to participate. Your consent will be active for this visit and any virtual visit you may have with one of our providers in the next 365 days. If you have a MyChart account, a copy of this consent can be sent to you electronically.  As this is a virtual visit, video technology does not allow for your provider to perform a traditional examination. This may limit your provider's ability to fully assess your condition. If your provider identifies any concerns that need to be evaluated in person or the need to arrange testing (such as labs, EKG, etc.), we will make arrangements to do so. Although advances in technology are sophisticated, we cannot ensure that it will always work on either your end or our end. If the connection with a video visit is poor, the visit may have to be switched to a telephone visit. With either a video or telephone visit, we are not always able to ensure that we have a secure connection.  By engaging in this virtual visit, you consent to the provision of healthcare and authorize for your insurance to be billed (if applicable) for the services provided during this visit. Depending on your insurance coverage, you may receive a charge related to this service.  I need to obtain your verbal consent now. Are you willing to proceed with your visit today? Scott Holland has provided verbal consent on 03/19/2024 for a virtual visit (video or telephone). Scott Borg, PA-C  Date: 03/19/2024 5:23 PM   Virtual Visit via Video Note   I, Scott Holland, connected with  Scott Holland  ([id number], [birth year]) on 03/19/24 at  5:15 PM EDT by a video-enabled telemedicine application and verified that I am speaking with the correct person using two identifiers.  Location: Patient: Virtual Visit Location Patient:  Home Provider: Virtual Visit Location Provider: Home Office   I discussed the limitations of evaluation and management by telemedicine and the availability of in person appointments. The patient expressed understanding and agreed to proceed.    History of Present Illness: Scott Holland is a 33 y.o. who identifies as a male who was assigned male at birth, and is being seen today for URI symptoms.  HPI: 33y/o M presents for a telehealth video visit for c/o  sinus pressure, pain, yellow/green nasal discharge x 10 days and worse waking up earlier today. Denies fever. Slight cough. He states sinus infections trigger his migraines and would like to get something before this triggers his migraine. Has been taking otc medicine without much relief.   Sinusitis    Problems:  Patient Active Problem List   Diagnosis Date Noted   Bilateral foot pain 01/09/2024   Back pain 01/09/2024   Decreased attention Span 02/28/2023   Migraine with aura and without status migrainosus, not intractable 02/24/2023   Insomnia due to other mental disorder 06/29/2022   Moderate episode of recurrent major depressive disorder (HCC) 06/29/2022   Benzodiazepine withdrawal (HCC) 06/18/2022   Poor dentition 06/18/2022   Left ankle pain 12/24/2021   Right cervical radiculopathy 09/07/2021   Chronic neck pain 05/19/2021   Right knee lateral meniscal tear 05/06/2021   GERD (gastroesophageal reflux disease) 12/21/2020   Arthralgia of both hands 12/21/2020   Decreased libido 12/21/2020   Depression, major, single episode, moderate (HCC) 06/04/2020   Generalized anxiety disorder with  panic attacks 06/04/2020   Essential hypertension 06/04/2020   Severe benzodiazepine use disorder (HCC) 05/06/2020    Allergies: No Known Allergies Medications:  Current Outpatient Medications:    albuterol  (VENTOLIN  HFA) 108 (90 Base) MCG/ACT inhaler, Inhale 2 puffs into the lungs every 4 (four) hours as needed for wheezing or shortness of  breath., Disp: 18 g, Rfl: 3   cyclobenzaprine  (FLEXERIL ) 10 MG tablet, Take 1 tablet (10 mg total) by mouth 2 (two) times daily as needed for muscle spasms., Disp: 60 tablet, Rfl: 2   lisinopril  (ZESTRIL ) 20 MG tablet, Take 1 tablet (20 mg total) by mouth daily., Disp: 90 tablet, Rfl: 1   pantoprazole  (PROTONIX ) 40 MG tablet, TAKE 1 TABLET BY MOUTH 2 TIMES A DAY, Disp: 180 tablet, Rfl: 0   pregabalin  (LYRICA ) 100 MG capsule, Take 1 capsule (100 mg total) by mouth 3 (three) times daily., Disp: 90 capsule, Rfl: 4   QUEtiapine  (SEROQUEL ) 200 MG tablet, Take 1 tablet (200 mg total) by mouth at bedtime., Disp: 90 tablet, Rfl: 1   rizatriptan  (MAXALT -MLT) 10 MG disintegrating tablet, TAKE 1 TABLET BY MOUTH DAILY AS NEEDED FOR MIGRAINE. MAY REPEAT IN 2 HOURS IF NEEDED, Disp: 10 tablet, Rfl: 2   topiramate  (TOPAMAX ) 50 MG tablet, Take 0.5 tablets (25 mg total) by mouth 2 (two) times daily., Disp: 180 tablet, Rfl: 1  Observations/Objective: Patient is well-developed, well-nourished in no acute distress.  Resting comfortably  at home.  Head is normocephalic, atraumatic.  No labored breathing.  Speech is clear and coherent with logical content.  Patient is alert and oriented at baseline.    Assessment and Plan: 1. Rhinosinusitis (Primary)  Increase fluids Take otc oral anti-histamine Use nasal saline spray as directed on the box. Consider Coricidin to help with symptoms, but if not improving in 2-3 days then fill Augmentin  Rx and start as prescribed. Schedule a virtual appointment or follow up at an urgent care clinic if symptoms don't improve.  Pt verbalized understanding and in agreement.    Follow Up Instructions: I discussed the assessment and treatment plan with the patient. The patient was provided an opportunity to ask questions and all were answered. The patient agreed with the plan and demonstrated an understanding of the instructions.  A copy of instructions were sent to the patient via  MyChart unless otherwise noted below.   Patient has requested to receive PHI (AVS, Work Notes, etc) pertaining to this video visit through e-mail as they are currently without active MyChart. They have voiced understand that email is not considered secure and their health information could be viewed by someone other than the patient.   The patient was advised to call back or seek an in-person evaluation if the symptoms worsen or if the condition fails to improve as anticipated.    Scott Soeder, PA-C

## 2024-03-19 NOTE — Patient Instructions (Signed)
  [name], thank you for joining Lovette Borg, PA-C for today's virtual visit.  While this provider is not your primary care provider (PCP), if your PCP is located in our provider database this encounter information will be shared with them immediately following your visit.   A [facility] MyChart account gives you access to today's visit and all your visits, tests, and labs performed at [facility]  click here if you don't have a [facility] MyChart account or go to [email or web address]  Consent: (Patient) Scott Holland provided verbal consent for this virtual visit at the beginning of the encounter.  Current Medications:  Current Outpatient Medications:    albuterol  (VENTOLIN  HFA) 108 (90 Base) MCG/ACT inhaler, Inhale 2 puffs into the lungs every 4 (four) hours as needed for wheezing or shortness of breath., Disp: 18 g, Rfl: 3   cyclobenzaprine  (FLEXERIL ) 10 MG tablet, Take 1 tablet (10 mg total) by mouth 2 (two) times daily as needed for muscle spasms., Disp: 60 tablet, Rfl: 2   lisinopril  (ZESTRIL ) 20 MG tablet, Take 1 tablet (20 mg total) by mouth daily., Disp: 90 tablet, Rfl: 1   pantoprazole  (PROTONIX ) 40 MG tablet, TAKE 1 TABLET BY MOUTH 2 TIMES A DAY, Disp: 180 tablet, Rfl: 0   pregabalin  (LYRICA ) 100 MG capsule, Take 1 capsule (100 mg total) by mouth 3 (three) times daily., Disp: 90 capsule, Rfl: 4   QUEtiapine  (SEROQUEL ) 200 MG tablet, Take 1 tablet (200 mg total) by mouth at bedtime., Disp: 90 tablet, Rfl: 1   rizatriptan  (MAXALT -MLT) 10 MG disintegrating tablet, TAKE 1 TABLET BY MOUTH DAILY AS NEEDED FOR MIGRAINE. MAY REPEAT IN 2 HOURS IF NEEDED, Disp: 10 tablet, Rfl: 2   topiramate  (TOPAMAX ) 50 MG tablet, Take 0.5 tablets (25 mg total) by mouth 2 (two) times daily., Disp: 180 tablet, Rfl: 1   Medications ordered in this encounter:  No orders of the defined types were placed in this encounter.    *If you need refills on other medications prior to your next  appointment, please contact your pharmacy*  Follow-Up: Call back or seek an in-person evaluation if the symptoms worsen or if the condition fails to improve as anticipated.  [facility] [phone]  Other Instructions Increase fluids Take otc oral anti-histamine Use nasal saline spray as directed on the box. Consider Coricidin to help with symptoms, but if not improving in 2-3 days then fill Augmentin  Rx and start as prescribed. Schedule a virtual appointment or follow up at an urgent care clinic if symptoms don't improve.    If you have been instructed to have an in-person evaluation today at a local Urgent Care facility, please use the link below. It will take you to a list of all of our available [facility], including address, phone number and hours of operation. Please do not delay care.  [facility]  If you or a family member do not have a primary care provider, use the link below to schedule a visit and establish care. When you choose a [facility] primary care physician or advanced practice provider, you gain a long-term partner in health. Find a Primary Care Provider  Learn more about [facility]'s in-office and virtual care options: [facility] - Get Care Now

## 2024-03-21 ENCOUNTER — Ambulatory Visit: Payer: Self-pay

## 2024-03-21 LAB — LAB REPORT - SCANNED

## 2024-03-21 NOTE — Telephone Encounter (Signed)
 FYI Only or Action Required?: FYI only for provider.  Patient was last seen in primary care on 01/09/2024 by Alvia Bring, DO.  Called Nurse Triage reporting Neck Pain.  Symptoms began 2 days ago.  Interventions attempted: OTC medications: tylenol , Prescription medications: Augmentin , and Rest, hydration, or home remedies.  Symptoms are: unchanged.  Triage Disposition: Go to ED Now (or PCP Triage)  Patient/caregiver understands and will follow disposition?: Yes  Copied from CRM #[id number]. Topic: Clinical - Red Word Triage >> Mar 21, 2024 11:32 AM Susanna ORN wrote: Red Word that prompted transfer to Nurse Triage: Patient called stating that he has a strain or crick in his neck. States it hurts really bad but he can move it left or right. States that when he does move his neck, the pain level is a 10 and it takes his breath away. States he's been sick for a while now so he doesn't know what's going on. Wants to be seen today, if possible. Reason for Disposition  Patient sounds very sick or weak to the triager  Answer Assessment - Initial Assessment Questions 1. ONSET: When did the pain begin?      2 days ago 2. LOCATION: Where does it hurt?      Left side but pain is on the right side as well 3. PATTERN Does the pain come and go, or has it been constant since it started?      constant 4. SEVERITY: How bad is the pain?  (Scale 0-10; or none or slight stiffness, mild, moderate, severe)     10 5. RADIATION: Does the pain go anywhere else, shoot into your arms?     Radiates into shoulder 6. CORD SYMPTOMS: Any weakness or numbness of the arms or legs?     no 7. CAUSE: What do you think is causing the neck pain?     Unsure-patient reports having been sick with URI symptoms for the last couple of days 8. NECK OVERUSE: Any recent activities that involved turning or twisting the neck?     no 9. OTHER SYMPTOMS: Do you have any other symptoms? (e.g., headache, fever, chest  pain, difficulty breathing, neck swelling)     Headache  Patient reports that when he turns his head, the pain is so intense that it takes his breath away.  Protocols used: Neck Pain or Stiffness-A-AH

## 2024-03-22 NOTE — Telephone Encounter (Signed)
 Forwarding to Zada Palin, NP covering for Dr. Alvia.

## 2024-04-23 ENCOUNTER — Emergency Department (HOSPITAL_COMMUNITY)
Admission: EM | Admit: 2024-04-23 | Discharge: 2024-04-23 | Disposition: A | Discharge status: Home / Self Care | Payer: Self-pay | Attending: Emergency Medicine | Admitting: Emergency Medicine

## 2024-04-23 ENCOUNTER — Encounter: Payer: Self-pay | Admitting: Sports Medicine

## 2024-04-23 ENCOUNTER — Encounter (HOSPITAL_COMMUNITY): Payer: Self-pay

## 2024-04-23 ENCOUNTER — Emergency Department (HOSPITAL_COMMUNITY): Payer: Self-pay

## 2024-04-23 ENCOUNTER — Other Ambulatory Visit: Payer: Self-pay

## 2024-04-23 DIAGNOSIS — R55 Syncope and collapse: Secondary | ICD-10-CM | POA: Diagnosis not present

## 2024-04-23 DIAGNOSIS — W01198A Fall on same level from slipping, tripping and stumbling with subsequent striking against other object, initial encounter: Secondary | ICD-10-CM | POA: Insufficient documentation

## 2024-04-23 DIAGNOSIS — M542 Cervicalgia: Secondary | ICD-10-CM | POA: Insufficient documentation

## 2024-04-23 DIAGNOSIS — R4182 Altered mental status, unspecified: Secondary | ICD-10-CM | POA: Diagnosis not present

## 2024-04-23 DIAGNOSIS — R051 Acute cough: Secondary | ICD-10-CM | POA: Insufficient documentation

## 2024-04-23 DIAGNOSIS — R569 Unspecified convulsions: Secondary | ICD-10-CM | POA: Insufficient documentation

## 2024-04-23 LAB — URINALYSIS, ROUTINE W REFLEX MICROSCOPIC
Bilirubin Urine: NEGATIVE
Glucose, UA: NEGATIVE mg/dL
Hgb urine dipstick: NEGATIVE
Ketones, ur: NEGATIVE mg/dL
Leukocytes,Ua: NEGATIVE
Nitrite: NEGATIVE
Protein, ur: NEGATIVE mg/dL
Specific Gravity, Urine: 1.016 (ref 1.005–1.030)
pH: 6 (ref 5.0–8.0)

## 2024-04-23 LAB — COMPREHENSIVE METABOLIC PANEL WITH GFR
ALT: 36 U/L (ref 0–44)
AST: 30 U/L (ref 15–41)
Albumin: 4.5 g/dL (ref 3.5–5.0)
Alkaline Phosphatase: 47 U/L (ref 38–126)
Anion gap: 12 (ref 5–15)
BUN: 12 mg/dL (ref 6–20)
CO2: 25 mmol/L (ref 22–32)
Calcium: 9.5 mg/dL (ref 8.9–10.3)
Chloride: 102 mmol/L (ref 98–111)
Creatinine, Ser: 0.84 mg/dL (ref 0.61–1.24)
GFR, Estimated: >60 mL/min (ref >60)
Glucose, Bld: 97 mg/dL (ref 70–99)
Potassium: 4.4 mmol/L (ref 3.5–5.1)
Sodium: 139 mmol/L (ref 135–145)
Total Bilirubin: 0.6 mg/dL (ref 0.0–1.2)
Total Protein: 7.4 g/dL (ref 6.5–8.1)

## 2024-04-23 LAB — CBC
HCT: 43 % (ref 39.0–52.0)
Hemoglobin: 14.4 g/dL (ref 13.0–17.0)
MCH: 31 pg (ref 26.0–34.0)
MCHC: 33.5 g/dL (ref 30.0–36.0)
MCV: 92.5 fL (ref 80.0–100.0)
Platelets: 265 K/uL (ref 150–400)
RBC: 4.65 MIL/uL (ref 4.22–5.81)
RDW: 12.9 % (ref 11.5–15.5)
WBC: 13.9 K/uL — ABNORMAL HIGH (ref 4.0–10.5)
nRBC: 0 % (ref 0.0–0.2)

## 2024-04-23 LAB — CBG MONITORING, ED
Glucose-Capillary: 109 mg/dL — ABNORMAL HIGH (ref 70–99)
Glucose-Capillary: 111 mg/dL — ABNORMAL HIGH (ref 70–99)

## 2024-04-23 LAB — MAGNESIUM: Magnesium: 2 mg/dL (ref 1.7–2.4)

## 2024-04-23 MED ORDER — DOXYCYCLINE HYCLATE 100 MG PO CAPS
100.0000 mg | ORAL_CAPSULE | Freq: Two times a day (BID) | ORAL | 0 refills | Status: AC
Start: 2024-04-23 — End: 2024-04-28

## 2024-04-23 MED ORDER — SODIUM CHLORIDE 0.9 % IV BOLUS
1000.0000 mL | Freq: Once | INTRAVENOUS | Status: AC
  Administered 2024-04-23: 1000 mL via INTRAVENOUS

## 2024-04-23 MED ORDER — LEVETIRACETAM 500 MG PO TABS: 500.0000 mg | ORAL_TABLET | Freq: Two times a day (BID) | ORAL | 0 refills | Status: AC

## 2024-04-23 NOTE — Discharge Instructions (Signed)
 As discussed, your evaluation today has been largely reassuring.  But, it is important that you monitor your condition carefully, and do not hesitate to return to the ED if you develop new, or concerning changes in your condition.  Otherwise, please follow-up with your physician for appropriate ongoing care.  In addition, please be sure to follow-up with our neurology colleagues given concern for seizure activity.

## 2024-04-23 NOTE — ED Triage Notes (Signed)
 Pt presents to ED from home C/O witnessed syncope and collapse. Reports hasn't been feeling well. Spouse reports pt had clenched jaw, was pale and diaphoretic. Hx seizures, none recently.   Also reports hx drug abuse (Xanax), and slipped up a few days ago.

## 2024-04-23 NOTE — ED Provider Notes (Signed)
 [place] EMERGENCY DEPARTMENT AT [facility] Provider Note   CSN: [id number] Arrival date & time: 04/23/24  9086     Patient presents with: Loss of Consciousness   Scott Holland is a 33 y.o. male.   HPI Patient presents with his wife who assists with the history.  He has a history of prior substance abuse, has been clean for some time, had small recent relapse with Xanax due to life stressors.  Today, he awoke feeling differently from normal, and while and route to a court appointment felt progressively worse, exited the car after stopping it safely, then had a witnessed loss of consciousness, fall striking his head, neck.  He was confused upon awakening, and now is fully awake, alert, oriented, though with soreness in his head, neck, buttocks. He notes that during 1 of prior episode of withdrawal he did have seizure-like activity.    Prior to Admission medications   Medication Sig Start Date End Date Taking? Authorizing Provider  doxycycline  (VIBRAMYCIN ) 100 MG capsule Take 1 capsule (100 mg total) by mouth 2 (two) times daily for 5 days. 04/23/24 04/28/24 Yes Garrick Charleston, MD  levETIRAcetam  (KEPPRA ) 500 MG tablet Take 1 tablet (500 mg total) by mouth 2 (two) times daily. 04/23/24  Yes Garrick Charleston, MD  albuterol  (VENTOLIN  HFA) 108 [phone] Base) MCG/ACT inhaler Inhale 2 puffs into the lungs every 4 (four) hours as needed for wheezing or shortness of breath. 04/05/21   Alexander, Natalie, DO  cyclobenzaprine  (FLEXERIL ) 10 MG tablet Take 1 tablet (10 mg total) by mouth 2 (two) times daily as needed for muscle spasms. 11/22/23   Alvia Bring, DO  lisinopril  (ZESTRIL ) 20 MG tablet Take 1 tablet (20 mg total) by mouth daily. 11/22/23   Alvia Bring, DO  pantoprazole  (PROTONIX ) 40 MG tablet TAKE 1 TABLET BY MOUTH 2 TIMES A DAY 02/22/24   Alvia Bring, DO  pregabalin  (LYRICA ) 100 MG capsule Take 1 capsule (100 mg total) by mouth 3 (three) times daily. 11/22/23   Alvia Bring, DO   QUEtiapine  (SEROQUEL ) 200 MG tablet Take 1 tablet (200 mg total) by mouth at bedtime. 11/22/23   Alvia Bring, DO  rizatriptan  (MAXALT -MLT) 10 MG disintegrating tablet TAKE 1 TABLET BY MOUTH DAILY AS NEEDED FOR MIGRAINE. MAY REPEAT IN 2 HOURS IF NEEDED 10/27/23   Alvan Dorothyann BIRCH, MD  topiramate  (TOPAMAX ) 50 MG tablet Take 0.5 tablets (25 mg total) by mouth 2 (two) times daily. 11/22/23   Alvia Bring, DO    Allergies: Patient has no known allergies.    Review of Systems  Updated Vital Signs BP 117/81 (BP Location: Left Arm)   Pulse 94   Temp 98.9 F (37.2 C) (Oral)   Resp 20   Ht 1.854 m (6' 1)   Wt 119.3 kg   SpO2 100%   BMI 34.70 kg/m   Physical Exam Vitals and nursing note reviewed.  Constitutional:      General: He is not in acute distress.    Appearance: He is well-developed.  HENT:     Head: Normocephalic and atraumatic.  Eyes:     Conjunctiva/sclera: Conjunctivae normal.  Neck:   Cardiovascular:     Rate and Rhythm: Normal rate and regular rhythm.  Pulmonary:     Effort: Pulmonary effort is normal. No respiratory distress.     Breath sounds: No stridor.  Abdominal:     General: There is no distension.  Skin:    General: Skin is warm and dry.  Neurological:     General: No focal deficit present.     Mental Status: He is alert and oriented to person, place, and time.     (all labs ordered are listed, but only abnormal results are displayed) Labs Reviewed  CBC - Abnormal; Notable for the following components:      Result Value   WBC 13.9 (*)    All other components within normal limits  CBG MONITORING, ED - Abnormal; Notable for the following components:   Glucose-Capillary 109 (*)    All other components within normal limits  CBG MONITORING, ED - Abnormal; Notable for the following components:   Glucose-Capillary 111 (*)    All other components within normal limits  COMPREHENSIVE METABOLIC PANEL WITH GFR  URINALYSIS, ROUTINE W REFLEX MICROSCOPIC   MAGNESIUM     EKG: EKG Interpretation Date/Time:  Tuesday April 23 2024 09:42:37 EDT Ventricular Rate:  81 PR Interval:  119 QRS Duration:  83 QT Interval:  358 QTC Calculation: 416 R Axis:   54  Text Interpretation: Sinus rhythm Borderline short PR interval Confirmed by Garrick Charleston [phone]) on 04/23/2024 9:59:56 AM  Radiology: CT Cervical Spine Wo Contrast Result Date: 04/23/2024 EXAM: CT HEAD AND CERVICAL SPINE 04/23/2024 11:14:15 AM TECHNIQUE: CT of the head and cervical spine was performed without the administration of intravenous contrast. Multiplanar reformatted images are provided for review. Automated exposure control, iterative reconstruction, and/or weight based adjustment of the mA/kV was utilized to reduce the radiation dose to as low as reasonably achievable. COMPARISON: 05/29/2021 CLINICAL HISTORY: Seizure, new-onset, no history of trauma; poss seizure w fall, head / neck trauma. Pt presents to ED from home C/O witnessed syncope and collapse. Reports hasn't been feeling well. Spouse reports pt had clenched jaw, was pale and diaphoretic. Hx seizures, none recently. Also reports hx drug abuse (Xanax), and slipped up a few days ago. FINDINGS: CT HEAD BRAIN AND VENTRICLES: No acute intracranial hemorrhage. No mass effect or midline shift. No abnormal extra-axial fluid collection. No evidence of acute infarct. No hydrocephalus. ORBITS: No acute abnormality. SINUSES AND MASTOIDS: Mucosal thickening throughout the ethmoid sinuses. SOFT TISSUES AND SKULL: No acute skull fracture. No acute soft tissue abnormality. CT CERVICAL SPINE BONES AND ALIGNMENT: Straightening of the normal cervical lordosis. No listhesis. No compression fracture or displaced fracture. No suspicious osseous lesion. DEGENERATIVE CHANGES: No significant degenerative changes. SOFT TISSUES: No prevertebral soft tissue swelling. IMPRESSION: 1. No acute intracranial abnormality. 2. No acute fracture or traumatic  malalignment of the cervical spine. Electronically signed by: Donnice Mania MD 04/23/2024 11:29 AM EDT RP Workstation: [id number]   CT Head Wo Contrast Result Date: 04/23/2024 EXAM: CT HEAD AND CERVICAL SPINE 04/23/2024 11:14:15 AM TECHNIQUE: CT of the head and cervical spine was performed without the administration of intravenous contrast. Multiplanar reformatted images are provided for review. Automated exposure control, iterative reconstruction, and/or weight based adjustment of the mA/kV was utilized to reduce the radiation dose to as low as reasonably achievable. COMPARISON: 05/29/2021 CLINICAL HISTORY: Seizure, new-onset, no history of trauma; poss seizure w fall, head / neck trauma. Pt presents to ED from home C/O witnessed syncope and collapse. Reports hasn't been feeling well. Spouse reports pt had clenched jaw, was pale and diaphoretic. Hx seizures, none recently. Also reports hx drug abuse (Xanax), and slipped up a few days ago. FINDINGS: CT HEAD BRAIN AND VENTRICLES: No acute intracranial hemorrhage. No mass effect or midline shift. No abnormal extra-axial fluid collection. No evidence of acute infarct. No hydrocephalus. ORBITS:  No acute abnormality. SINUSES AND MASTOIDS: Mucosal thickening throughout the ethmoid sinuses. SOFT TISSUES AND SKULL: No acute skull fracture. No acute soft tissue abnormality. CT CERVICAL SPINE BONES AND ALIGNMENT: Straightening of the normal cervical lordosis. No listhesis. No compression fracture or displaced fracture. No suspicious osseous lesion. DEGENERATIVE CHANGES: No significant degenerative changes. SOFT TISSUES: No prevertebral soft tissue swelling. IMPRESSION: 1. No acute intracranial abnormality. 2. No acute fracture or traumatic malalignment of the cervical spine. Electronically signed by: Donnice Mania MD 04/23/2024 11:29 AM EDT RP Workstation: [id number]   DG Chest 2 View Result Date: 04/23/2024 EXAM: 2 VIEW(S) XRAY OF THE CHEST 04/23/2024 10:18:00 AM  COMPARISON: 06/17/2022 CLINICAL HISTORY: Cough, ALOC. Syncope and fall today. FINDINGS: LUNGS AND PLEURA: No focal pulmonary opacity. No pulmonary edema. No pleural effusion. No pneumothorax. HEART AND MEDIASTINUM: No acute abnormality of the cardiac and mediastinal silhouettes. BONES AND SOFT TISSUES: No acute osseous abnormality. IMPRESSION: 1. No acute process. Electronically signed by: Rockey Kilts MD 04/23/2024 10:53 AM EDT RP Workstation: [id number]     Procedures   Medications Ordered in the ED  sodium chloride  0.9 % bolus 1,000 mL (1,000 mLs Intravenous New Bag/Given 04/23/24 1032)                                    Medical Decision Making Well-appearing adult male presents after an episode of altered mental status, concern for syncope versus seizure, arrhythmia less likely given the absence of history of this. Cardiac 90 sinus normal pulse ox 100% room air normal labs CT monitoring commenced  Amount and/or Complexity of Data Reviewed Independent Historian: spouse External Data Reviewed: notes. Labs: ordered. Decision-making details documented in ED Course. Radiology: ordered and independent interpretation performed. Decision-making details documented in ED Course. ECG/medicine tests: ordered and independent interpretation performed. Decision-making details documented in ED Course.  Risk Prescription drug management. Decision regarding hospitalization. Diagnosis or treatment significantly limited by social determinants of health.   12:09 PM Patient in no distress, no additional changes in cognition, nor seizure activity.  Would like conversation about all results, head CT, neck CT, both unremarkable, labs within normal limits.  With consideration of seizure in the context of prior seizures, recent use of Xanax, patient will start Keppra  will follow-up with neurology.  Driving restrictions discussed. Given patient's ongoing URI-like illness, patient will also have a course of  antibiotics. Patient discharged in stable condition.     Final diagnoses:  Seizure (HCC)  Acute cough    ED Discharge Orders          Ordered    levETIRAcetam  (KEPPRA ) 500 MG tablet  2 times daily        04/23/24 1209    doxycycline  (VIBRAMYCIN ) 100 MG capsule  2 times daily        04/23/24 1209               Garrick Charleston, MD 04/23/24 1209

## 2024-04-23 NOTE — ED Notes (Signed)
 Pt reports when he fell, he hit his head on the pavement. Rn notes no obvious injury, trauma, bruising or swelling

## 2024-05-13 ENCOUNTER — Ambulatory Visit: Payer: Self-pay

## 2024-05-13 NOTE — Telephone Encounter (Signed)
 FYI Only or Action Required?: FYI only for provider. Scheduled for acute appointment tomorrow in office at 10:30 AM  Patient was last seen in primary care on 03/19/2024 by Gandhi, Safal, PA-C.  Called Nurse Triage reporting Anxiety.  Symptoms began a week ago.  Interventions attempted: Rest, hydration, or home remedies.  Symptoms are: unchanged.  Triage Disposition: See Physician Within 24 Hours  Patient/caregiver understands and will follow disposition?: Yes  Copied from CRM #[id number]. Topic: Clinical - Red Word Triage >> May 13, 2024 10:48 AM Rosaria A wrote: Red Word that prompted transfer to Nurse Triage: Patients wife states that he is having anxiety and depressed. The medication his PCP put him on is not working and patient stopped taking it. Patient would also like another provider, feels as though his current PCP does not listen to him and keep telling him to keep trying the medication. Reason for Disposition  Patient sounds very upset or troubled to the triager  Answer Assessment - Initial Assessment Questions 1. CONCERN: Did anything happen that prompted you to call today?      Increased anxiety and depression 2. ANXIETY SYMPTOMS: Can you describe how you (your loved one; patient) have been feeling? (e.g., tense, restless, panicky, anxious, keyed up, overwhelmed, sense of impending doom).      Anxious, tense, restless, panicky, shaky, not sleeping 3. ONSET: How long have you been feeling this way? (e.g., hours, days, weeks)     Started within the last week. Stopped his lyrica  4. SEVERITY: How would you rate the level of anxiety? (e.g., 0 - 10; or mild, moderate, severe).     severe 5. FUNCTIONAL IMPAIRMENT: How have these feelings affected your ability to do daily activities? Have you had more difficulty than usual doing your normal daily activities? (e.g., getting better, same, worse; self-care, school, work, interactions)     Having more difficulty completing  daily activities 6. HISTORY: Have you felt this way before? Have you ever been diagnosed with an anxiety problem in the past? (e.g., generalized anxiety disorder, panic attacks, PTSD). If Yes, ask: How was this problem treated? (e.g., medicines, counseling, etc.)     Before he got on any medication 7. RISK OF HARM - SUICIDAL IDEATION: Do you ever have thoughts of hurting or killing yourself? If Yes, ask:  Do you have these feelings now? Do you have a plan on how you would do this?     no 8. TREATMENT:  What has been done so far to treat this anxiety? (e.g., medicines, relaxation strategies). What has helped?     medications 9. THERAPIST: Do you have a counselor or therapist? If Yes, ask: What is their name?     no 10. POTENTIAL TRIGGERS: Do you drink caffeinated beverages (e.g., coffee, colas, teas), and how much daily? Do you drink alcohol or use any drugs? Have you started any new medicines recently?       Started keppra -no alcohol but drinks caffeine  11. PATIENT SUPPORT: Who is with you now? Who do you live with? Do you have family or friends who you can talk to?        wife 73. OTHER SYMPTOMS: Do you have any other symptoms? (e.g., feeling depressed, trouble concentrating, trouble sleeping, trouble breathing, palpitations or fast heartbeat, chest pain, sweating, nausea, or diarrhea)       Trouble sleeping, nausea, diarrhea  Protocols used: Anxiety and Panic Attack-A-AH

## 2024-05-14 ENCOUNTER — Encounter: Payer: Self-pay | Admitting: Medical-Surgical

## 2024-05-14 ENCOUNTER — Ambulatory Visit (INDEPENDENT_AMBULATORY_CARE_PROVIDER_SITE_OTHER): Payer: Self-pay | Admitting: Medical-Surgical

## 2024-05-14 VITALS — BP 123/80 | HR 100 | Temp 97.7°F | Resp 18 | Ht 73.0 in | Wt 257.0 lb

## 2024-05-14 DIAGNOSIS — M5412 Radiculopathy, cervical region: Secondary | ICD-10-CM

## 2024-05-14 DIAGNOSIS — F411 Generalized anxiety disorder: Secondary | ICD-10-CM

## 2024-05-14 DIAGNOSIS — G43109 Migraine with aura, not intractable, without status migrainosus: Secondary | ICD-10-CM

## 2024-05-14 DIAGNOSIS — R569 Unspecified convulsions: Secondary | ICD-10-CM

## 2024-05-14 DIAGNOSIS — I10 Essential (primary) hypertension: Secondary | ICD-10-CM

## 2024-05-14 DIAGNOSIS — F410 Panic disorder [episodic paroxysmal anxiety] without agoraphobia: Secondary | ICD-10-CM

## 2024-05-14 MED ORDER — PROPRANOLOL HCL ER 60 MG PO CP24: 60.0000 mg | ORAL_CAPSULE | Freq: Every day | ORAL | 0 refills | Status: DC

## 2024-05-14 NOTE — Assessment & Plan Note (Signed)
 Seizures likely related to medication withdrawal. Currently on Keppra  for seizure prevention. - Continue Keppra  for seizure prevention.

## 2024-05-14 NOTE — Progress Notes (Signed)
 Established patient visit   History of Present Illness   Discussed the use of AI scribe software for clinical note transcription with the patient, who gave verbal consent to proceed.  History of Present Illness   Scott Holland is a 33 year old male with anxiety who presents with worsening anxiety and medication management issues.  Anxiety symptoms and medication management - Worsening anxiety characterized by feeling 'on edge' and anxious over minor issues - Physical symptoms include shaking, sweating, nausea, and pacing - Benzodiazepines (Xanax, Klonopin) previously effective but discontinued due to addiction concerns - Multiple antidepressants and anti-anxiety medications (Wellbutrin , Buspar , Prozac , Celexa, Lexapro , gabapentin , Lyrica ) tried with limited efficacy and adverse effects - History of addiction beginning with Xanax use in [place]   Sleep disturbance - Seroquel  taken at bedtime improves sleep until 6:30-7:00 AM - Sleep remains restless despite medication  Seizure history - History of seizures after discontinuing Lyrica  and taking Xanax  Chronic migraine and blood pressure management - Chronic migraines managed with metoprolol  in the past but now on Topamax  - Lisinopril  for hypertension causes erectile dysfunction - Viagra  used for erectile dysfunction triggers migraines  Neck and shoulder pain with neurological symptoms - Severe neck and shoulder pain with numbness in fingers for over one week - Muscle relaxers and deep tissue massage provide temporary relief - Naproxen  and Tylenol  offer short-lived pain relief  Fertility challenges - 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  Constitutional:      General: He is not in acute distress.    Appearance: Normal appearance. He is obese. He is not ill-appearing.  HENT:     Head: Normocephalic and atraumatic.   Cardiovascular:     Rate and Rhythm: Regular rhythm. Tachycardia present.  Pulmonary:     Effort: Pulmonary effort is normal. No respiratory distress.  Skin:    General: Skin is warm and dry.  Neurological:     Mental Status: He is alert and oriented to person, place, and time.  Psychiatric:        Attention and Perception: He is inattentive.        Mood and Affect: Mood is anxious. Affect is tearful.        Speech: Speech normal.        Behavior: Behavior is agitated.        Thought Content: Thought content normal.        Judgment: Judgment normal.    Assessment & Plan   Problem List Items Addressed This Visit       Cardiovascular and Mediastinum   Essential hypertension   Blood pressure at 123/80 mmHg with mild tachycardia. Discussed switching to propranolol  for anxiety and migraine management. - Switch from lisinopril  to propranolol  extended release, 60 mg once daily.      Relevant Medications   propranolol  ER (INDERAL  LA) 60 MG 24 hr capsule   Migraine with aura and without status migrainosus, not intractable   Chronic migraines previously treated with metoprolol . Discussed propranolol  for migraine prevention due to beta blocker properties. - Switch from lisinopril  to propranolol , which may aid in migraine prevention.      Relevant Medications   propranolol  ER (INDERAL  LA) 60 MG 24 hr capsule     Nervous and Auditory   Right cervical radiculopathy   Chronic neck and shoulder pain with numbness in fingers, likely due to cervical radiculopathy.  Previous treatments include muscle relaxers and massage with temporary relief. Discussed physical therapy and dry needling. - Recommend use of tennis ball or golf ball for point pressure relief. - Suggest use of Tiger Balm or lidocaine patches for topical relief. - Refer to physical therapy for potential dry needling treatment.       Relevant Orders   Ambulatory referral to Physical Therapy     Other   Generalized anxiety  disorder with panic attacks - Primary   Chronic anxiety with significant impact on daily life. Previous benzodiazepine use with addiction issues. Current medications include Seroquel  and lisinopril . Discussed propranolol  for anxiety management and potential to alleviate physical symptoms. GeneSight test recommended for medication guidance. - Switch from lisinopril  to propranolol  extended release, 60 mg once daily. - Order GeneSight test for medication guidance. - Continue Seroquel . - Follow-up in a couple of weeks to assess propranolol  efficacy. - Discuss potential referral to psychiatrist once insurance is active.      Seizure (HCC)   Seizures likely related to medication withdrawal. Currently on Keppra  for seizure prevention. - Continue Keppra  for seizure prevention.        Follow up   Return in about 2 weeks (around 05/28/2024) for mood follow up. __________________________________ Zada FREDRIK Palin, DNP, APRN, FNP-BC Primary Care and Sports Medicine [facility] [place]

## 2024-05-14 NOTE — Assessment & Plan Note (Signed)
 Chronic migraines previously treated with metoprolol . Discussed propranolol  for migraine prevention due to beta blocker properties. - Switch from lisinopril  to propranolol , which may aid in migraine prevention.

## 2024-05-14 NOTE — Assessment & Plan Note (Signed)
 Blood pressure at 123/80 mmHg with mild tachycardia. Discussed switching to propranolol  for anxiety and migraine management. - Switch from lisinopril  to propranolol  extended release, 60 mg once daily.

## 2024-05-14 NOTE — Assessment & Plan Note (Signed)
 Chronic neck and shoulder pain with numbness in fingers, likely due to cervical radiculopathy. Previous treatments include muscle relaxers and massage with temporary relief. Discussed physical therapy and dry needling. - Recommend use of tennis ball or golf ball for point pressure relief. - Suggest use of Tiger Balm or lidocaine patches for topical relief. - Refer to physical therapy for potential dry needling treatment.

## 2024-05-14 NOTE — Assessment & Plan Note (Signed)
 Chronic anxiety with significant impact on daily life. Previous benzodiazepine use with addiction issues. Current medications include Seroquel  and lisinopril . Discussed propranolol  for anxiety management and potential to alleviate physical symptoms. GeneSight test recommended for medication guidance. - Switch from lisinopril  to propranolol  extended release, 60 mg once daily. - Order GeneSight test for medication guidance. - Continue Seroquel . - Follow-up in a couple of weeks to assess propranolol  efficacy. - Discuss potential referral to psychiatrist once insurance is active.

## 2024-05-18 ENCOUNTER — Other Ambulatory Visit: Payer: Self-pay | Admitting: Family Medicine

## 2024-05-22 ENCOUNTER — Ambulatory Visit: Admitting: Family Medicine

## 2024-05-29 ENCOUNTER — Ambulatory Visit: Admitting: Family Medicine

## 2024-06-09 ENCOUNTER — Other Ambulatory Visit: Payer: Self-pay | Admitting: Medical-Surgical

## 2024-06-14 ENCOUNTER — Encounter: Payer: Self-pay | Admitting: Medical-Surgical

## 2024-06-14 DIAGNOSIS — F5105 Insomnia due to other mental disorder: Secondary | ICD-10-CM

## 2024-06-14 MED ORDER — PROPRANOLOL HCL ER 60 MG PO CP24: 60.0000 mg | ORAL_CAPSULE | Freq: Every day | ORAL | 0 refills | Status: AC

## 2024-06-14 MED ORDER — QUETIAPINE FUMARATE 200 MG PO TABS: 200.0000 mg | ORAL_TABLET | Freq: Every day | ORAL | 0 refills | Status: AC

## 2024-06-18 ENCOUNTER — Inpatient Hospital Stay: Admitting: Medical-Surgical

## 2024-06-19 ENCOUNTER — Inpatient Hospital Stay: Admitting: Family Medicine

## 2024-06-24 ENCOUNTER — Ambulatory Visit: Admitting: Medical-Surgical

## 2024-06-24 NOTE — Progress Notes (Deleted)

## 2024-06-25 ENCOUNTER — Encounter: Payer: Self-pay | Admitting: Family Medicine

## 2024-06-25 NOTE — Therapy (Incomplete)
 OUTPATIENT PHYSICAL THERAPY CERVICAL EVALUATION   Patient Name: Scott Holland MRN: [id number] DOB:28-Dec-1990, 33 y.o., male Today's Date: 06/25/2024   END OF SESSION:   Past Medical History:  Diagnosis Date   Anxiety    Asthma    Depression    Drug use    Hypertension    Past Surgical History:  Procedure Laterality Date   FRACTURE SURGERY     Patient Active Problem List   Diagnosis Date Noted   Bilateral foot pain 01/09/2024   Back pain 01/09/2024   Decreased attention Span 02/28/2023   Migraine with aura and without status migrainosus, not intractable 02/24/2023   Insomnia due to other mental disorder 06/29/2022   Moderate episode of recurrent major depressive disorder (HCC) 06/29/2022   Benzodiazepine withdrawal (HCC) 06/18/2022   Poor dentition 06/18/2022   Left ankle pain 12/24/2021   Right cervical radiculopathy 09/07/2021   Seizure (HCC) 06/01/2021   Chronic neck pain 05/19/2021   Right knee lateral meniscal tear 05/06/2021   GERD (gastroesophageal reflux disease) 12/21/2020   Arthralgia of both hands 12/21/2020   Decreased libido 12/21/2020   Depression, major, single episode, moderate (HCC) 06/04/2020   Generalized anxiety disorder with panic attacks 06/04/2020   Essential hypertension 06/04/2020   Severe benzodiazepine use disorder (HCC) 05/06/2020    PCP: Alvia Bring, DO   REFERRING PROVIDER: Alvia Bring, DO   REFERRING DIAG: [phone] (ICD-10-CM) - Right cervical radiculopathy  THERAPY DIAG:  No diagnosis found.  RATIONALE FOR EVALUATION AND TREATMENT: Rehabilitation  ONSET DATE: ***  NEXT MD VISIT: ***   SUBJECTIVE:                                                                                                                                                                                                         SUBJECTIVE STATEMENT: Seizures, Anxiety, depression, asthma, drug use, benzodiazepene abuse, migraines, back pain, chronic  neck pain, foot pain  Hand dominance: {MISC; OT HAND DOMINANCE:[phone]}  PAIN: Are you having pain? Yes: NPRS scale: *** Pain location: *** Pain description: *** Aggravating factors: *** Relieving factors: ***  PERTINENT HISTORY:  ***  PRECAUTIONS: {Therapy precautions:24002}  RED FLAGS: {PT Red Flags:29287}  HAND DOMINANCE: {Hand Dominance:29389}  WEIGHT BEARING RESTRICTIONS: {Yes ***/No:24003}  FALLS:  Has patient fallen in last 6 months? {fallsyesno:27318}  LIVING ENVIRONMENT: Lives with: {[facility] lives with:25569::lives with their family} Lives in: {Lives in:25570} Stairs: {opstairs:27293} Has following equipment at home: {Assistive devices:23999}  OCCUPATION: ***  PLOF: {PLOF:24004}  PATIENT GOALS: ***   OBJECTIVE: (objective measures completed at initial evaluation unless  otherwise dated)  DIAGNOSTIC FINDINGS:  EXAM: CT HEAD AND CERVICAL SPINE 04/23/2024 11:14:15 AM   TECHNIQUE: CT of the head and cervical spine was performed without the administration of intravenous contrast. Multiplanar reformatted images are provided for review. Automated exposure control, iterative reconstruction, and/or weight based adjustment of the mA/kV was utilized to reduce the radiation dose to as low as reasonably achievable.   COMPARISON: 05/29/2021   CLINICAL HISTORY: Seizure, new-onset, no history of trauma; poss seizure w fall, head / neck trauma. Pt presents to ED from home C/O witnessed syncope and collapse. Reports hasn't been feeling well. Spouse reports pt had clenched jaw, was pale and diaphoretic. Hx seizures, none recently. Also reports hx drug abuse (Xanax), and slipped up a few days ago.   FINDINGS: CT HEAD   BRAIN AND VENTRICLES: No acute intracranial hemorrhage. No mass effect or midline shift. No abnormal extra-axial fluid collection. No evidence of acute infarct. No hydrocephalus.   ORBITS: No acute abnormality.   SINUSES AND  MASTOIDS: Mucosal thickening throughout the ethmoid sinuses.   SOFT TISSUES AND SKULL: No acute skull fracture. No acute soft tissue abnormality.   CT CERVICAL SPINE   BONES AND ALIGNMENT: Straightening of the normal cervical lordosis. No listhesis. No compression fracture or displaced fracture. No suspicious osseous lesion.   DEGENERATIVE CHANGES: No significant degenerative changes.   SOFT TISSUES: No prevertebral soft tissue swelling.   IMPRESSION: 1. No acute intracranial abnormality. 2. No acute fracture or traumatic malalignment of the cervical spine.   Electronically signed by: Donnice Mania MD 04/23/2024 11:29 AM EDT RP Workstation: [id number]    PATIENT SURVEYS:  NDI:  NECK DISABILITY INDEX  Date: *** Score  Pain intensity {NDI-1:32931}  2. Personal care (washing, dressing, etc.) {NDI-2:32932}  3. Lifting {NDI-3:32933}  4. Reading {NDI-4:32934}  5. Headaches {NDI-5:32935}  6. Concentration {NDI-6:32936}  7. Work {NDI-7:32937}  8. Driving {WIP-1:67061}  9. Sleeping {NDI-9:32939}  10. Recreation {NDI-10:32940}  Total ***/50   Minimum Detectable Change (90% confidence): 5 points or 10% points  COGNITION: Overall cognitive status: {cognition:24006}  SENSATION: {sensation:27233}  POSTURE:  {posture:25561}  PALPATION: ***   CERVICAL ROM:   Active ROM A/PROM  eval  Flexion   Extension   Right lateral flexion   Left lateral flexion   Right rotation   Left rotation    (Blank rows = not tested)  UPPER EXTREMITY ROM:  Active ROM Right eval Left eval  Shoulder flexion    Shoulder extension    Shoulder abduction    Shoulder adduction    Shoulder internal rotation    Shoulder external rotation    Elbow flexion    Elbow extension    Wrist flexion    Wrist extension    Wrist ulnar deviation    Wrist radial deviation    Wrist pronation    Wrist supination     (Blank rows = not tested)  UPPER EXTREMITY MMT:  MMT Right eval Left eval   Shoulder flexion    Shoulder extension    Shoulder abduction    Shoulder adduction    Shoulder internal rotation    Shoulder external rotation    Middle trapezius    Lower trapezius    Elbow flexion    Elbow extension    Wrist flexion    Wrist extension    Wrist ulnar deviation    Wrist radial deviation    Wrist pronation    Wrist supination    Grip strength     (  Blank rows = not tested)  CERVICAL SPECIAL TESTS:  {Cervical special tests:25246}  FUNCTIONAL TESTS:  {Functional tests:24029}   TODAY'S TREATMENT:   ***   PATIENT EDUCATION:  Education details: PT eval findings, anticipated POC, and initial HEP  Person educated: Patient Education method: Explanation, Demonstration, Verbal cues, Tactile cues, Handouts, and [employer] app access provided Education comprehension: verbalized understanding, verbal cues required, tactile cues required, and needs further education  HOME EXERCISE PROGRAM: ***  ASSESSMENT:  CLINICAL IMPRESSION: Scott Holland is a 33 y.o. male who was referred to physical therapy for evaluation and treatment for cervical radiculopathy.   Patient reports onset of *** pain beginning ***. Pain is worse with ***.  Patient has deficits in *** ROM, *** flexibility, *** strength, ***abnormal posture, and TTP with abnormal muscle tension *** which are interfering with ADLs and are impacting quality of life.  On NDI patient scored ***/50 demonstrating ***% or *** disability.  Scott Holland will benefit from skilled PT to address above deficits to improve mobility and activity tolerance with decreased pain interference.  OBJECTIVE IMPAIRMENTS: {opptimpairments:25111}.   ACTIVITY LIMITATIONS: {activitylimitations:27494}  PARTICIPATION LIMITATIONS: {participationrestrictions:25113}  PERSONAL FACTORS: Behavior pattern, Past/current experiences, and 1-2 comorbidities: Seizures, Anxiety, depression, asthma, drug use, benzodiazepene abuse, migraines, back pain, chronic  neck pain, foot pain are also affecting patient's functional outcome.   REHAB POTENTIAL: Good  CLINICAL DECISION MAKING: Evolving/moderate complexity  EVALUATION COMPLEXITY: Moderate   GOALS: Goals reviewed with patient? Yes  SHORT TERM GOALS: Target date: 07/23/2024   Patient will be independent with initial HEP to improve outcomes and carryover.  Baseline: 100% PT assist required for correct completion Goal status: INITIAL  2.  Patient will report 25% improvement in neck pain to improve QOL.  Baseline:  Goal status: INITIAL  LONG TERM GOALS: Target date: 08/20/2024   Patient will be independent with ongoing/advanced HEP for self-management at home.  Baseline: no advanced HEP yet Goal status: INITIAL  2.  Patient will demonstrate improved posture to decrease muscle imbalance. Baseline: *** Goal status: INITIAL  3.  Patient will report 50-75% improvement in neck pain to improve QOL.  Baseline: *** Goal status: INITIAL  4.  Patient to report 50-75% reduction in frequency and intensity of weekly headaches/migraines.   Baseline: *** Goal status: INITIAL   5.  Patient will demonstrate full pain free cervical ROM for safety with driving.  Baseline: Refer to above cervical ROM table Goal status: INITIAL  6.  Patient will report </= ***% on NDI (MCID = 10%) to demonstrate improved functional ability.  Baseline: *** Goal status: INITIAL     PLAN:  PT FREQUENCY: 1-2x/week  PT DURATION: 8 weeks  PLANNED INTERVENTIONS: 97164- PT Re-evaluation, 97110-Therapeutic exercises, 97530- Therapeutic activity, 97112- Neuromuscular re-education, 97535- Self Care, 02859- Manual therapy, [id number]- Electrical stimulation (unattended), 97016- Vasopneumatic device, [id number]- Ultrasound, [id number]- Traction (mechanical), [id number]- Ionotophoresis 4mg /ml Dexamethasone, 79439 (1-2 muscles), 20561 (3+ muscles)- Dry Needling, Patient/Family education, Taping, Joint mobilization, Spinal mobilization,  Cryotherapy, and Moist heat  PLAN FOR NEXT SESSION: PIERRETTE RED SENIOR, PT 06/25/2024, 9:17 PM

## 2024-06-26 ENCOUNTER — Ambulatory Visit: Payer: Self-pay | Admitting: Rehabilitation

## 2024-07-01 ENCOUNTER — Encounter: Payer: Self-pay | Admitting: Medical-Surgical

## 2024-08-29 ENCOUNTER — Other Ambulatory Visit: Payer: Self-pay | Admitting: Family Medicine

## 2024-09-27 ENCOUNTER — Other Ambulatory Visit: Payer: Self-pay

## 2024-09-27 ENCOUNTER — Encounter (HOSPITAL_COMMUNITY): Payer: Self-pay | Admitting: Emergency Medicine

## 2024-09-27 ENCOUNTER — Emergency Department (HOSPITAL_COMMUNITY): Admission: EM | Admit: 2024-09-27 | Payer: Self-pay | Source: Home / Self Care

## 2024-09-27 LAB — COMPREHENSIVE METABOLIC PANEL WITH GFR
ALT: 15 U/L (ref 0–44)
AST: 18 U/L (ref 15–41)
Albumin: 4.8 g/dL (ref 3.5–5.0)
Alkaline Phosphatase: 64 U/L (ref 38–126)
Anion gap: 12 (ref 5–15)
BUN: 10 mg/dL (ref 6–20)
CO2: 23 mmol/L (ref 22–32)
Calcium: 10.5 mg/dL — ABNORMAL HIGH (ref 8.9–10.3)
Chloride: 101 mmol/L (ref 98–111)
Creatinine, Ser: 0.88 mg/dL (ref 0.61–1.24)
GFR, Estimated: >60 mL/min
Glucose, Bld: 113 mg/dL — ABNORMAL HIGH (ref 70–99)
Potassium: 4.3 mmol/L (ref 3.5–5.1)
Sodium: 136 mmol/L (ref 135–145)
Total Bilirubin: 0.4 mg/dL (ref 0.0–1.2)
Total Protein: 8 g/dL (ref 6.5–8.1)

## 2024-09-27 LAB — CBC
HCT: 41.7 % (ref 39.0–52.0)
Hemoglobin: 14.5 g/dL (ref 13.0–17.0)
MCH: 31.5 pg (ref 26.0–34.0)
MCHC: 34.8 g/dL (ref 30.0–36.0)
MCV: 90.5 fL (ref 80.0–100.0)
Platelets: 340 10*3/uL (ref 150–400)
RBC: 4.61 MIL/uL (ref 4.22–5.81)
RDW: 12.1 % (ref 11.5–15.5)
WBC: 18.4 10*3/uL — ABNORMAL HIGH (ref 4.0–10.5)
nRBC: 0 % (ref 0.0–0.2)

## 2024-09-27 LAB — LIPASE, BLOOD: Lipase: 15 U/L (ref 11–51)

## 2024-09-27 NOTE — ED Triage Notes (Signed)
 Pt c/o N/D with intermittent abdominal pain since last night. Last diarrhea episode was last night.
